# Patient Record
Sex: Male | Born: 1940
Health system: Southern US, Community
[De-identification: ages and names within clinical notes are randomized; demographics above are authoritative.]

## PROBLEM LIST (undated history)

## (undated) DIAGNOSIS — E785 Hyperlipidemia, unspecified: Secondary | ICD-10-CM

## (undated) DIAGNOSIS — K219 Gastro-esophageal reflux disease without esophagitis: Secondary | ICD-10-CM

## (undated) DIAGNOSIS — R011 Cardiac murmur, unspecified: Secondary | ICD-10-CM

## (undated) DIAGNOSIS — I10 Essential (primary) hypertension: Secondary | ICD-10-CM

## (undated) HISTORY — PX: OTHER SURGICAL HISTORY: SHX169

## (undated) HISTORY — DX: Hyperlipidemia, unspecified: E78.5

## (undated) HISTORY — DX: Cardiac murmur, unspecified: R01.1

## (undated) HISTORY — DX: Essential (primary) hypertension: I10

## (undated) HISTORY — DX: Gastro-esophageal reflux disease without esophagitis: K21.9

---
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
## 2008-02-06 ENCOUNTER — Ambulatory Visit (HOSPITAL_COMMUNITY): Admission: RE | Admit: 2008-02-06 | Discharge: 2008-02-06 | Payer: Self-pay | Admitting: Specialist

## 2009-08-09 ENCOUNTER — Ambulatory Visit: Payer: Self-pay | Admitting: Internal Medicine

## 2009-08-09 DIAGNOSIS — K219 Gastro-esophageal reflux disease without esophagitis: Secondary | ICD-10-CM

## 2009-08-09 DIAGNOSIS — R011 Cardiac murmur, unspecified: Secondary | ICD-10-CM | POA: Insufficient documentation

## 2009-08-09 DIAGNOSIS — I10 Essential (primary) hypertension: Secondary | ICD-10-CM | POA: Insufficient documentation

## 2009-08-09 DIAGNOSIS — E785 Hyperlipidemia, unspecified: Secondary | ICD-10-CM

## 2009-08-10 ENCOUNTER — Telehealth: Payer: Self-pay | Admitting: Internal Medicine

## 2009-08-11 ENCOUNTER — Telehealth: Payer: Self-pay

## 2009-08-26 ENCOUNTER — Ambulatory Visit: Payer: Self-pay | Admitting: Cardiology

## 2009-08-26 ENCOUNTER — Ambulatory Visit (HOSPITAL_COMMUNITY): Admission: RE | Admit: 2009-08-26 | Discharge: 2009-08-26 | Payer: Self-pay | Admitting: Internal Medicine

## 2009-08-26 ENCOUNTER — Ambulatory Visit: Payer: Self-pay

## 2009-08-26 ENCOUNTER — Encounter: Payer: Self-pay | Admitting: Internal Medicine

## 2010-07-16 LAB — CONVERTED CEMR LAB
ALT: 48 units/L (ref 0–53)
AST: 35 units/L (ref 0–37)
Albumin: 4.3 g/dL (ref 3.5–5.2)
Alkaline Phosphatase: 83 units/L (ref 39–117)
BUN: 19 mg/dL (ref 6–23)
Basophils Absolute: 0 10*3/uL (ref 0.0–0.1)
Basophils Relative: 0.7 % (ref 0.0–3.0)
Bilirubin, Direct: 0 mg/dL (ref 0.0–0.3)
CO2: 33 meq/L — ABNORMAL HIGH (ref 19–32)
Calcium: 9.6 mg/dL (ref 8.4–10.5)
Chloride: 107 meq/L (ref 96–112)
Cholesterol: 234 mg/dL — ABNORMAL HIGH (ref 0–200)
Creatinine, Ser: 1.5 mg/dL (ref 0.4–1.5)
Eosinophils Absolute: 0.2 10*3/uL (ref 0.0–0.7)
Eosinophils Relative: 2.6 % (ref 0.0–5.0)
GFR calc non Af Amer: 49.53 mL/min (ref >60)
Glucose, Bld: 92 mg/dL (ref 70–99)
HCT: 50.7 % (ref 39.0–52.0)
Hemoglobin: 16.4 g/dL (ref 13.0–17.0)
Lymphocytes Relative: 31.6 % (ref 12.0–46.0)
Lymphs Abs: 2.2 10*3/uL (ref 0.7–4.0)
MCHC: 32.4 g/dL (ref 30.0–36.0)
MCV: 94.5 fL (ref 78.0–100.0)
Monocytes Absolute: 0.7 10*3/uL (ref 0.1–1.0)
Monocytes Relative: 9.8 % (ref 3.0–12.0)
Neutro Abs: 4 10*3/uL (ref 1.4–7.7)
Neutrophils Relative %: 55.3 % (ref 43.0–77.0)
PSA: 0.52 ng/mL (ref 0.10–4.00)
Platelets: 218 10*3/uL (ref 150.0–400.0)
Potassium: 5.1 meq/L (ref 3.5–5.1)
RBC: 5.37 M/uL (ref 4.22–5.81)
RDW: 13.8 % (ref 11.5–14.6)
Sodium: 146 meq/L — ABNORMAL HIGH (ref 135–145)
TSH: 2.28 microintl units/mL (ref 0.35–5.50)
Total Bilirubin: 0.4 mg/dL (ref 0.3–1.2)
Total Protein: 7.7 g/dL (ref 6.0–8.3)
WBC: 7.1 10*3/uL (ref 4.5–10.5)

## 2010-07-18 NOTE — Progress Notes (Signed)
Summary: 2nd request for phone #  Phone Note Outgoing Call   Call placed by: Duard Brady LPN,  August 11, 2009 1:32 PM Call placed to: Patient Summary of Call: attempt to call again to obtain previous doctor/clinic name and ph number to be able to get records.  Initial call taken by: Duard Brady LPN,  August 11, 2009 1:33 PM

## 2010-07-18 NOTE — Assessment & Plan Note (Signed)
Summary: NEW PT/NJR   Vital Signs:  Patient profile:   70 year old male Height:      72.5 inches Weight:      252 pounds BMI:     33.83 Temp:     97.3 degrees F oral BP sitting:   180 / 100  (left arm) Cuff size:   large  Vitals Entered By: Duard Brady LPN (August 09, 2009 1:30 PM) CC: new pt - out of HTN meds - , saw his wife (patty) yesterday Is Patient Diabetic? No   CC:  new pt - out of HTN meds -  and saw his wife (patty) yesterday.  History of Present Illness: 70 year old patient who is seen today to establish with our practice.  He has a 8 year history of treated hypertension, and a two-year history of treated dyslipidemia.  He also has a history of gastroesophageal reflux disease, which has been stable.  Preventive Screening-Counseling & Management  Alcohol-Tobacco     Smoking Status: quit  Allergies (verified): No Known Drug Allergies  Past History:  Past Medical History: Hyperlipidemia Hypertension systolic murmur GERD hemorrhoids  Past Surgical History: status post outpatient elbow  surgery bilaterally Tonsillectomy 1948 no prior colonoscopy ( declines)  Family History: Reviewed history and no changes required. both parents died at age 69.  Father the complications of a" bile duct tumor" history of hypertension and diabetes mother died of acute MI at age 69 two brothers 4 sisters, unremarkable except for hypertension  Social History: Reviewed history and no changes required. Married Carpenter discontinued tobacco 3 years ago 7 children and stepchildren, 11 grandchildren, 13 great-grandchildrenSmoking Status:  quit  Review of Systems  The patient denies anorexia, fever, weight loss, weight gain, vision loss, decreased hearing, hoarseness, chest pain, syncope, dyspnea on exertion, peripheral edema, prolonged cough, headaches, hemoptysis, abdominal pain, melena, hematochezia, severe indigestion/heartburn, hematuria, incontinence, genital  sores, muscle weakness, suspicious skin lesions, transient blindness, difficulty walking, depression, unusual weight change, abnormal bleeding, enlarged lymph nodes, angioedema, breast masses, and testicular masses.    Physical Exam  General:  overweight-appearing.blood pressure is 150/100 Head:  Normocephalic and atraumatic without obvious abnormalities. No apparent alopecia or balding. Eyes:  No corneal or conjunctival inflammation noted. EOMI. Perrla. Funduscopic exam benign, without hemorrhages, exudates or papilledema. Vision grossly normal. Ears:  External ear exam shows no significant lesions or deformities.  Otoscopic examination reveals clear canals, tympanic membranes are intact bilaterally without bulging, retraction, inflammation or discharge. Hearing is grossly normal bilaterally. Nose:  External nasal examination shows no deformity or inflammation. Nasal mucosa are pink and moist without lesions or exudates. Mouth:  Oral mucosa and oropharynx without lesions or exudates.  edentulous Neck:  No deformities, masses, or tenderness noted. Chest Wall:  No deformities, masses, tenderness or gynecomastia noted. Breasts:  No masses or gynecomastia noted Lungs:  Normal respiratory effort, chest expands symmetrically. Lungs are clear to auscultation, no crackles or wheezes. Heart:  grade 2/6 systolic murmur, loudest at the primary aortic area Abdomen:  Bowel sounds positive,abdomen soft and non-tender without masses, organomegaly or hernias noted. Rectal:  external hemorrhoid(s).  still in there Genitalia:  Testes bilaterally descended without nodularity, tenderness or masses. No scrotal masses or lesions. No penis lesions or urethral discharge. Prostate:  1+ enlarged.   Msk:  No deformity or scoliosis noted of thoracic or lumbar spine.   Pulses:   posterior tibial pulses intact;  dorsalis pedis pulses faint Extremities:  No clubbing, cyanosis, edema, or deformity noted with normal  full range  of motion of all joints.   Neurologic:  No cranial nerve deficits noted. Station and gait are normal. Plantar reflexes are down-going bilaterally. DTRs are symmetrical throughout. Sensory, motor and coordinative functions appear intact. Skin:  Intact without suspicious lesions or rashes Cervical Nodes:  No lymphadenopathy noted Axillary Nodes:  No palpable lymphadenopathy Inguinal Nodes:  No significant adenopathy Psych:  Cognition and judgment appear intact. Alert and cooperative with normal attention span and concentration. No apparent delusions, illusions, hallucinations   Impression & Recommendations:  Problem # 1:  GERD (ICD-530.81)  Orders: Venipuncture (16109) TLB-BMP (Basic Metabolic Panel-BMET) (80048-METABOL) TLB-CBC Platelet - w/Differential (85025-CBCD) TLB-Hepatic/Liver Function Pnl (80076-HEPATIC) TLB-TSH (Thyroid Stimulating Hormone) (84443-TSH) TLB-Cholesterol, Total (82465-CHO) TLB-PSA (Prostate Specific Antigen) (84153-PSA)  Problem # 2:  HYPERTENSION (ICD-401.9)  His updated medication list for this problem includes:    Amlodipine Besylate 5 Mg Tabs (Amlodipine besylate) ..... Qd    Ramipril 10 Mg Caps (Ramipril) ..... Qd  Orders: Venipuncture (60454) TLB-BMP (Basic Metabolic Panel-BMET) (80048-METABOL) TLB-CBC Platelet - w/Differential (85025-CBCD) TLB-Hepatic/Liver Function Pnl (80076-HEPATIC) TLB-TSH (Thyroid Stimulating Hormone) (84443-TSH) TLB-Cholesterol, Total (82465-CHO) TLB-PSA (Prostate Specific Antigen) (84153-PSA)  Problem # 3:  HYPERTENSION, MILD (ICD-401.1)  His updated medication list for this problem includes:    Amlodipine Besylate 5 Mg Tabs (Amlodipine besylate) ..... Qd    Ramipril 10 Mg Caps (Ramipril) ..... Qd  Orders: EKG w/ Interpretation (93000)  Complete Medication List: 1)  Simvastatin 20 Mg Tabs (Simvastatin) .... Qd 2)  Amlodipine Besylate 5 Mg Tabs (Amlodipine besylate) .... Qd 3)  Ramipril 10 Mg Caps (Ramipril) ....  Qd  Other Orders: Echo Referral (Echo)  Patient Instructions: 1)  Limit your Sodium (Salt). 2)  It is important that you exercise regularly at least 20 minutes 5 times a week. If you develop chest pain, have severe difficulty breathing, or feel very tired , stop exercising immediately and seek medical attention. 3)  You need to lose weight. Consider a lower calorie diet and regular exercise.  4)  Check your Blood Pressure regularly. If it is above: 150/90  you should make an appointment. Prescriptions: RAMIPRIL 10 MG CAPS (RAMIPRIL) qd  #90 x 6   Entered and Authorized by:   Gordy Savers  MD   Signed by:   Gordy Savers  MD on 08/09/2009   Method used:   Print then Give to Patient   RxID:   [id number] AMLODIPINE BESYLATE 5 MG TABS (AMLODIPINE BESYLATE) qd  #90 x 6   Entered and Authorized by:   Gordy Savers  MD   Signed by:   Gordy Savers  MD on 08/09/2009   Method used:   Print then Give to Patient   RxID:   [id number] SIMVASTATIN 20 MG TABS (SIMVASTATIN) qd  #90 x 6   Entered and Authorized by:   Gordy Savers  MD   Signed by:   Gordy Savers  MD on 08/09/2009   Method used:   Print then Give to Patient   RxID:   [id number]

## 2010-07-18 NOTE — Progress Notes (Signed)
Summary: need dr. name and number  Phone Note Outgoing Call   Call placed by: Duard Brady LPN,  August 10, 2009 11:56 AM Call placed to: Patient Summary of Call: called hm# - ans mach - LMTCB with previous doctor/clinic name and phone  # so we can send release of information for records. KIK Initial call taken by: Duard Brady LPN,  August 10, 2009 11:57 AM

## 2010-09-11 ENCOUNTER — Encounter: Payer: Self-pay | Admitting: Internal Medicine

## 2010-09-12 ENCOUNTER — Encounter: Payer: Self-pay | Admitting: Internal Medicine

## 2010-09-12 ENCOUNTER — Ambulatory Visit (INDEPENDENT_AMBULATORY_CARE_PROVIDER_SITE_OTHER): Payer: Medicare PPO | Admitting: Internal Medicine

## 2010-09-12 DIAGNOSIS — M25569 Pain in unspecified knee: Secondary | ICD-10-CM

## 2010-09-12 DIAGNOSIS — I10 Essential (primary) hypertension: Secondary | ICD-10-CM

## 2010-09-12 DIAGNOSIS — M25561 Pain in right knee: Secondary | ICD-10-CM

## 2010-09-12 NOTE — Progress Notes (Signed)
  Subjective:    Patient ID: Willie Smith, male    DOB: 1940/09/10, 70 y.o.   MRN: [id number]  HPI   70 year old patient who has a history of hypertension. He is seen in this office very infrequently. He is retired and spends much time on the road and in [place]. He does state that he has been compliant with his medication blood pressure on arrival today 160/100. He also has a history of dyslipidemia and there has been no recent laboratory testing done   He presents today with a chief complaint of right knee pain this has been present for 2 weeks and began while he was fishing in [place]. He has about a 4 year history of mild left knee pain at his right knee has never been problematic. His weight is 258   Review of Systems  Constitutional: Negative for fever, chills, appetite change and fatigue.  HENT: Negative for hearing loss, ear pain, congestion, sore throat, trouble swallowing, neck stiffness, dental problem, voice change and tinnitus.   Eyes: Negative for pain, discharge and visual disturbance.  Respiratory: Negative for cough, chest tightness, wheezing and stridor.   Cardiovascular: Negative for chest pain, palpitations and leg swelling.  Gastrointestinal: Negative for nausea, vomiting, abdominal pain, diarrhea, constipation, blood in stool and abdominal distention.  Genitourinary: Negative for urgency, hematuria, flank pain, discharge, difficulty urinating and genital sores.  Musculoskeletal: Positive for joint swelling and gait problem. Negative for myalgias, back pain and arthralgias.  Skin: Negative for rash.  Neurological: Negative for dizziness, syncope, speech difficulty, weakness, numbness and headaches.  Hematological: Negative for adenopathy. Does not bruise/bleed easily.  Psychiatric/Behavioral: Negative for behavioral problems and dysphoric mood. The patient is not nervous/anxious.        Objective:   Physical Exam  Constitutional: He appears  well-developed. No distress.        Blood pressure 160/100  Musculoskeletal:        A mild effusion was noted of the right knee          Assessment & Plan:   right knee pain. Probable meniscal tear. He has been quite symptomatic for 2 weeks we'll set up for orthopedic referral  Hypertension. Poor control. He has recently drove back from [place]. Home blood pressure monitor and encouraged as well as a two-week followup  Dyslipidemia. Needs followup lab  Health maintenance. Needs complete physical

## 2010-09-12 NOTE — Patient Instructions (Signed)
Orthopedic followup as scheduled Limit your sodium (Salt) intake  Please check your blood pressure on a regular basis.  If it is consistently greater than 150/90, please make an office appointment.  Return in two weeks for follow-up

## 2010-09-26 ENCOUNTER — Ambulatory Visit (INDEPENDENT_AMBULATORY_CARE_PROVIDER_SITE_OTHER): Payer: Medicare PPO | Admitting: Internal Medicine

## 2010-09-26 ENCOUNTER — Encounter: Payer: Self-pay | Admitting: Internal Medicine

## 2010-09-26 VITALS — BP 160/90 | Temp 97.5°F | Wt 255.0 lb

## 2010-09-26 DIAGNOSIS — I10 Essential (primary) hypertension: Secondary | ICD-10-CM

## 2010-09-26 MED ORDER — AMLODIPINE BESYLATE 5 MG PO TABS: 10.0000 mg | ORAL_TABLET | Freq: Every day | ORAL | Status: DC

## 2010-09-26 MED ORDER — SIMVASTATIN 20 MG PO TABS: 20.0000 mg | ORAL_TABLET | Freq: Every day | ORAL | Status: DC

## 2010-09-26 MED ORDER — RAMIPRIL 10 MG PO CAPS: 10.0000 mg | ORAL_CAPSULE | Freq: Every day | ORAL | Status: DC

## 2010-09-26 NOTE — Progress Notes (Signed)
  Subjective:    Patient ID: Willie Smith, male    DOB: [birth year], 70 y.o.   MRN: [id number]  HPI  70 year old patient who is seen today for followup of his hypertension. He has been monitoring home blood pressure readings were consistently high readings. He feels well except for some persistent right knee pain. He does have a orthopedic appointment scheduled.    Review of Systems  Musculoskeletal: Positive for joint swelling and gait problem.       Objective:   Physical Exam  Constitutional: He appears well-developed and well-nourished. No distress.       Blood pressure 160/90 both arms          Assessment & Plan:  Hypertension. Suboptimal control. We'll increase his amlodipine to 10 mg daily. If blood pressures are consistently less than 140/90 after 2 weeks we'll add hydrochlorothiazide 25 mg daily he return in 6 weeks for followup

## 2010-09-26 NOTE — Patient Instructions (Signed)
Please check your blood pressure on a regular basis.  If it is consistently greater than 150/90, please make an office appointment.  If  blood pressure reading he is consistently greater than 140/90 start hydrochlorothiazide 25 mg daily    It is important that you exercise regularly, at least 20 minutes 3 to 4 times per week.  If you develop chest pain or shortness of breath seek  medical attention.  Limit your sodium (Salt) intake

## 2010-09-27 ENCOUNTER — Other Ambulatory Visit: Payer: Self-pay | Admitting: Internal Medicine

## 2010-09-27 MED ORDER — AMLODIPINE BESYLATE 5 MG PO TABS: 10.0000 mg | ORAL_TABLET | Freq: Every day | ORAL | Status: DC

## 2011-01-18 ENCOUNTER — Encounter: Payer: Self-pay | Admitting: Internal Medicine

## 2011-01-18 ENCOUNTER — Ambulatory Visit (INDEPENDENT_AMBULATORY_CARE_PROVIDER_SITE_OTHER): Payer: Medicare PPO | Admitting: Internal Medicine

## 2011-01-18 VITALS — BP 170/90 | Temp 98.0°F | Wt 256.0 lb

## 2011-01-18 DIAGNOSIS — I10 Essential (primary) hypertension: Secondary | ICD-10-CM

## 2011-01-18 MED ORDER — HYDROCHLOROTHIAZIDE 25 MG PO TABS: 25.0000 mg | ORAL_TABLET | Freq: Every day | ORAL | Status: DC

## 2011-01-18 MED ORDER — AMLODIPINE BESYLATE 10 MG PO TABS: 10.0000 mg | ORAL_TABLET | Freq: Every day | ORAL | Status: DC

## 2011-01-18 NOTE — Progress Notes (Signed)
  Subjective:    Patient ID: Willie Smith, male    DOB: May 28, 1941, 70 y.o.   MRN: [id number]  HPI  70 year old patient who is seen today for followup of his hypertension. He is doing quite well;  he has had mild pedal edema since increasing his amlodipine to 10 mg daily no chest pain or shortness of breath.    Review of Systems  Constitutional: Negative for fever, chills, appetite change and fatigue.  HENT: Negative for hearing loss, ear pain, congestion, sore throat, trouble swallowing, neck stiffness, dental problem, voice change and tinnitus.   Eyes: Negative for pain, discharge and visual disturbance.  Respiratory: Negative for cough, chest tightness, wheezing and stridor.   Cardiovascular: Negative for chest pain, palpitations and leg swelling.  Gastrointestinal: Negative for nausea, vomiting, abdominal pain, diarrhea, constipation, blood in stool and abdominal distention.  Genitourinary: Negative for urgency, hematuria, flank pain, discharge, difficulty urinating and genital sores.  Musculoskeletal: Negative for myalgias, back pain, joint swelling, arthralgias and gait problem.  Skin: Negative for rash.  Neurological: Negative for dizziness, syncope, speech difficulty, weakness, numbness and headaches.  Hematological: Negative for adenopathy. Does not bruise/bleed easily.  Psychiatric/Behavioral: Negative for behavioral problems and dysphoric mood. The patient is not nervous/anxious.        Objective:   Physical Exam  Constitutional: He is oriented to person, place, and time. He appears well-developed.       Overweight. Weight 256 Blood pressure 150/80  HENT:  Head: Normocephalic.  Right Ear: External ear normal.  Left Ear: External ear normal.  Eyes: Conjunctivae and EOM are normal.  Neck: Normal range of motion.  Cardiovascular: Normal rate and normal heart sounds.   Pulmonary/Chest: Breath sounds normal.  Abdominal: Bowel sounds are normal.  Genitourinary:       Right  greater than left lower extremity edema  Musculoskeletal: Normal range of motion. He exhibits edema. He exhibits no tenderness.  Neurological: He is alert and oriented to person, place, and time.  Psychiatric: He has a normal mood and affect. His behavior is normal.          Assessment & Plan:   Hypertension suboptimal control Pedal edema  Restricted salt  diet weight loss exercise all encouraged We'll place on hydrochlorothiazide 25 mg daily

## 2011-01-18 NOTE — Patient Instructions (Signed)
Limit your sodium (Salt) intake    It is important that you exercise regularly, at least 20 minutes 3 to 4 times per week.  If you develop chest pain or shortness of breath seek  medical attention.  You need to lose weight.  Consider a lower calorie diet and regular exercise.  Return in 3 months for follow-up  

## 2011-10-31 ENCOUNTER — Other Ambulatory Visit: Payer: Self-pay | Admitting: Internal Medicine

## 2012-02-05 ENCOUNTER — Encounter: Payer: Self-pay | Admitting: Internal Medicine

## 2012-02-05 ENCOUNTER — Ambulatory Visit (INDEPENDENT_AMBULATORY_CARE_PROVIDER_SITE_OTHER): Payer: Medicare PPO | Admitting: Internal Medicine

## 2012-02-05 VITALS — BP 132/80 | Temp 98.1°F | Wt 249.0 lb

## 2012-02-05 DIAGNOSIS — E785 Hyperlipidemia, unspecified: Secondary | ICD-10-CM

## 2012-02-05 DIAGNOSIS — I10 Essential (primary) hypertension: Secondary | ICD-10-CM

## 2012-02-05 MED ORDER — RAMIPRIL 10 MG PO CAPS: 10.0000 mg | ORAL_CAPSULE | Freq: Every day | ORAL | Status: DC

## 2012-02-05 MED ORDER — AMLODIPINE BESYLATE 10 MG PO TABS: 10.0000 mg | ORAL_TABLET | Freq: Every day | ORAL | Status: DC

## 2012-02-05 MED ORDER — HYDROCHLOROTHIAZIDE 25 MG PO TABS: 25.0000 mg | ORAL_TABLET | Freq: Every day | ORAL | Status: DC

## 2012-02-05 MED ORDER — SIMVASTATIN 20 MG PO TABS: 20.0000 mg | ORAL_TABLET | Freq: Every day | ORAL | Status: DC

## 2012-02-05 NOTE — Patient Instructions (Signed)
Limit your sodium (Salt) intake  Please check your blood pressure on a regular basis.  If it is consistently greater than 150/90, please make an office appointment.  You need to lose weight.  Consider a lower calorie diet and regular exercise.    It is important that you exercise regularly, at least 20 minutes 3 to 4 times per week.  If you develop chest pain or shortness of breath seek  medical attention.  Return in 6 months for follow-up  

## 2012-02-05 NOTE — Progress Notes (Signed)
Subjective:    Patient ID: Willie Smith, male    DOB: [birth year], 71 y.o.   MRN: [id number]  HPI  71 year old patient who is seen today for followup. He has treated hypertension and dyslipidemia. He is done remarkably well. Since his last visit here he has had bilateral knee arthroscopic surgery and doing quite well. He is discontinued the use of sweet tea and there has been some modest weight loss. He feels quite well.  Past Medical History  Diagnosis Date  . Hyperlipidemia   . Hypertension   . Systolic murmur   . GERD (gastroesophageal reflux disease)   . Hemorrhoids     History   Social History  . Marital Status: Single    Spouse Name: N/A    Number of Children: N/A  . Years of Education: N/A   Occupational History  . Not on file.   Social History Main Topics  . Smoking status: Former Smoker    Quit date: 06/18/2005  . Smokeless tobacco: Never Used  . Alcohol Use: Yes     occasionally  . Drug Use: No  . Sexually Active: Not on file   Other Topics Concern  . Not on file   Social History Narrative  . No narrative on file    Past Surgical History  Procedure Date  . Surgery elbow bilaterally   . Tonsillectomy 1948  . Colonsocopy     declines    Family History  Problem Relation Age of Onset  . Heart attack Mother   . Hypertension Father   . Diabetes Father   . Hypertension Brother   . Hypertension Brother   . Hypertension Sister   . Hypertension Sister   . Hypertension Sister     No Known Allergies  Current Outpatient Prescriptions on File Prior to Visit  Medication Sig Dispense Refill  . ramipril (ALTACE) 10 MG capsule TAKE ONE CAPSULE BY MOUTH EVERY DAY  90 capsule  0  . simvastatin (ZOCOR) 20 MG tablet TAKE ONE TABLET BY MOUTH EVERY DAY AT BEDTIME  90 tablet  0  . DISCONTD: amLODipine (NORVASC) 10 MG tablet Take 1 tablet (10 mg total) by mouth daily.  90 tablet  6  . DISCONTD: hydrochlorothiazide 25 MG tablet Take 1 tablet (25 mg total) by mouth  daily.  90 tablet  6    BP 132/80  Temp 98.1 F (36.7 C) (Oral)  Wt 249 lb (112.946 kg)      Review of Systems  Constitutional: Negative for fever, chills, appetite change and fatigue.  HENT: Negative for hearing loss, ear pain, congestion, sore throat, trouble swallowing, neck stiffness, dental problem, voice change and tinnitus.   Eyes: Negative for pain, discharge and visual disturbance.  Respiratory: Negative for cough, chest tightness, wheezing and stridor.   Cardiovascular: Negative for chest pain, palpitations and leg swelling.  Gastrointestinal: Negative for nausea, vomiting, abdominal pain, diarrhea, constipation, blood in stool and abdominal distention.  Genitourinary: Negative for urgency, hematuria, flank pain, discharge, difficulty urinating and genital sores.  Musculoskeletal: Negative for myalgias, back pain, joint swelling, arthralgias and gait problem.  Skin: Negative for rash.  Neurological: Negative for dizziness, syncope, speech difficulty, weakness, numbness and headaches.  Hematological: Negative for adenopathy. Does not bruise/bleed easily.  Psychiatric/Behavioral: Negative for behavioral problems and dysphoric mood. The patient is not nervous/anxious.        Objective:   Physical Exam  Constitutional: He is oriented to person, place, and time. He appears well-developed.  HENT:  Head: Normocephalic.  Right Ear: External ear normal.  Left Ear: External ear normal.  Eyes: Conjunctivae and EOM are normal.  Neck: Normal range of motion.  Cardiovascular: Normal rate and normal heart sounds.   Pulmonary/Chest: Breath sounds normal.  Abdominal: Bowel sounds are normal.  Musculoskeletal: Normal range of motion. He exhibits no edema and no tenderness.  Neurological: He is alert and oriented to person, place, and time.  Psychiatric: He has a normal mood and affect. His behavior is normal.          Assessment & Plan:   Hypertension. We'll continue  present regimen medicines refilled Dyslipidemia stable. Set up for a complete physical. Check a lipid profile at that time.

## 2013-01-14 ENCOUNTER — Ambulatory Visit (INDEPENDENT_AMBULATORY_CARE_PROVIDER_SITE_OTHER): Payer: Medicare PPO | Admitting: Internal Medicine

## 2013-01-14 ENCOUNTER — Encounter: Payer: Self-pay | Admitting: Internal Medicine

## 2013-01-14 VITALS — BP 160/90 | HR 68 | Temp 97.8°F | Resp 20 | Wt 257.0 lb

## 2013-01-14 DIAGNOSIS — E785 Hyperlipidemia, unspecified: Secondary | ICD-10-CM

## 2013-01-14 DIAGNOSIS — Z23 Encounter for immunization: Secondary | ICD-10-CM

## 2013-01-14 DIAGNOSIS — I10 Essential (primary) hypertension: Secondary | ICD-10-CM

## 2013-01-14 DIAGNOSIS — K219 Gastro-esophageal reflux disease without esophagitis: Secondary | ICD-10-CM

## 2013-01-14 DIAGNOSIS — R011 Cardiac murmur, unspecified: Secondary | ICD-10-CM

## 2013-01-14 LAB — COMPREHENSIVE METABOLIC PANEL
AST: 24 U/L (ref 0–37)
BUN: 17 mg/dL (ref 6–23)
Calcium: 9.4 mg/dL (ref 8.4–10.5)
Chloride: 104 mEq/L (ref 96–112)
Creatinine, Ser: 1.4 mg/dL (ref 0.4–1.5)
GFR: 53.98 mL/min — ABNORMAL LOW (ref >60.00)

## 2013-01-14 LAB — CBC WITH DIFFERENTIAL/PLATELET
Basophils Relative: 0.6 % (ref 0.0–3.0)
Eosinophils Relative: 2.8 % (ref 0.0–5.0)
Hemoglobin: 15.6 g/dL (ref 13.0–17.0)
Lymphocytes Relative: 26.2 % (ref 12.0–46.0)
MCHC: 33.1 g/dL (ref 30.0–36.0)
Monocytes Relative: 9.9 % (ref 3.0–12.0)
Neutro Abs: 4.9 10*3/uL (ref 1.4–7.7)
RBC: 5.17 Mil/uL (ref 4.22–5.81)

## 2013-01-14 LAB — LIPID PANEL
Cholesterol: 149 mg/dL (ref 0–200)
LDL Cholesterol: 89 mg/dL (ref 0–99)
Triglycerides: 148 mg/dL (ref 0.0–149.0)

## 2013-01-14 MED ORDER — AMLODIPINE BESYLATE 10 MG PO TABS: 10.0000 mg | ORAL_TABLET | Freq: Every day | ORAL | Status: DC

## 2013-01-14 MED ORDER — SIMVASTATIN 20 MG PO TABS: 20.0000 mg | ORAL_TABLET | Freq: Every day | ORAL | Status: DC

## 2013-01-14 MED ORDER — HYDROCHLOROTHIAZIDE 25 MG PO TABS: 25.0000 mg | ORAL_TABLET | Freq: Every day | ORAL | Status: DC

## 2013-01-14 MED ORDER — RAMIPRIL 10 MG PO CAPS: 10.0000 mg | ORAL_CAPSULE | Freq: Every day | ORAL | Status: DC

## 2013-01-14 NOTE — Patient Instructions (Signed)
Limit your sodium (Salt) intake  You need to lose weight.  Consider a lower calorie diet and regular exercise.    It is important that you exercise regularly, at least 20 minutes 3 to 4 times per week.  If you develop chest pain or shortness of breath seek  medical attention.  Please check your blood pressure on a regular basis.  If it is consistently greater than 150/90, please make an office appointment.  Return in one year for follow-up

## 2013-01-14 NOTE — Progress Notes (Signed)
Subjective:    Patient ID: Willie Smith, male    DOB: Feb 18, 1941, 72 y.o.   MRN: [id number]  HPI  72 year old patient who is seen today for followup. He has not been seen in about one year. Stable medical problems include hypertension dyslipidemia gastroesophageal reflux disease. He is in the lab for approximately 3 years. He is doing quite well today. There is been some modest weight gain. He also has a history of systolic murmur that was evaluated by a 2-D echocardiogram. This revealed aortic sclerosis only.   Past Medical History  Diagnosis Date  . Hyperlipidemia   . Hypertension   . Systolic murmur   . GERD (gastroesophageal reflux disease)   . Hemorrhoids     History   Social History  . Marital Status: Single    Spouse Name: N/A    Number of Children: N/A  . Years of Education: N/A   Occupational History  . Not on file.   Social History Main Topics  . Smoking status: Former Smoker    Quit date: 06/18/2005  . Smokeless tobacco: Never Used  . Alcohol Use: Yes     Comment: occasionally  . Drug Use: No  . Sexually Active: Not on file   Other Topics Concern  . Not on file   Social History Narrative  . No narrative on file    Past Surgical History  Procedure Laterality Date  . Surgery elbow bilaterally    . Tonsillectomy  1948  . Colonsocopy      declines    Family History  Problem Relation Age of Onset  . Heart attack Mother   . Hypertension Father   . Diabetes Father   . Hypertension Brother   . Hypertension Brother   . Hypertension Sister   . Hypertension Sister   . Hypertension Sister     No Known Allergies  Current Outpatient Prescriptions on File Prior to Visit  Medication Sig Dispense Refill  . amLODipine (NORVASC) 10 MG tablet Take 1 tablet (10 mg total) by mouth daily.  90 tablet  3  . hydrochlorothiazide (HYDRODIURIL) 25 MG tablet Take 1 tablet (25 mg total) by mouth daily.  90 tablet  3  . ramipril (ALTACE) 10 MG capsule Take 1 capsule  (10 mg total) by mouth daily.  90 capsule  3  . simvastatin (ZOCOR) 20 MG tablet Take 1 tablet (20 mg total) by mouth at bedtime.  90 tablet  3   No current facility-administered medications on file prior to visit.    BP 160/90  Pulse 68  Temp(Src) 97.8 F (36.6 C) (Oral)  Resp 20  Wt 257 lb (116.574 kg)  BMI 34.36 kg/m2  SpO2 98%      Review of Systems  Constitutional: Negative for fever, chills, appetite change and fatigue.  HENT: Negative for hearing loss, ear pain, congestion, sore throat, trouble swallowing, neck stiffness, dental problem, voice change and tinnitus.   Eyes: Negative for pain, discharge and visual disturbance.  Respiratory: Negative for cough, chest tightness, wheezing and stridor.   Cardiovascular: Negative for chest pain, palpitations and leg swelling.  Gastrointestinal: Negative for nausea, vomiting, abdominal pain, diarrhea, constipation, blood in stool and abdominal distention.  Genitourinary: Negative for urgency, hematuria, flank pain, discharge, difficulty urinating and genital sores.  Musculoskeletal: Negative for myalgias, back pain, joint swelling, arthralgias and gait problem.  Skin: Negative for rash.  Neurological: Negative for dizziness, syncope, speech difficulty, weakness, numbness and headaches.  Hematological: Negative for adenopathy. Does  not bruise/bleed easily.  Psychiatric/Behavioral: Negative for behavioral problems and dysphoric mood. The patient is not nervous/anxious.        Objective:   Physical Exam  Constitutional: He is oriented to person, place, and time. He appears well-developed.  Repeat blood pressure as low as 140/84  HENT:  Head: Normocephalic.  Right Ear: External ear normal.  Left Ear: External ear normal.  Eyes: Conjunctivae and EOM are normal.  Neck: Normal range of motion.  Cardiovascular: Normal rate and normal heart sounds.   Pulmonary/Chest: Breath sounds normal.  Abdominal: Bowel sounds are normal.   Musculoskeletal: Normal range of motion. He exhibits no edema and no tenderness.  Neurological: He is alert and oriented to person, place, and time.  Psychiatric: He has a normal mood and affect. His behavior is normal.          Assessment & Plan:   Hypertension Dyslipidemia Obesity  Weight loss encouraged More careful home blood pressure monitoring recommended Increased activity Lab update CPX one year

## 2013-01-14 NOTE — Progress Notes (Signed)
  Subjective:    Patient ID: Willie Smith, male    DOB: Jun 20, 1940, 72 y.o.   MRN: [id number]  HPI  Wt Readings from Last 3 Encounters:  01/14/13 257 lb (116.574 kg)  02/05/12 249 lb (112.946 kg)  01/18/11 256 lb (116.121 kg)    Review of Systems     Objective:   Physical Exam        Assessment & Plan:

## 2013-04-21 ENCOUNTER — Ambulatory Visit (INDEPENDENT_AMBULATORY_CARE_PROVIDER_SITE_OTHER): Payer: Medicare PPO | Admitting: Internal Medicine

## 2013-04-21 ENCOUNTER — Encounter: Payer: Self-pay | Admitting: Internal Medicine

## 2013-04-21 VITALS — BP 160/80 | HR 76 | Temp 97.9°F | Resp 20 | Wt 257.0 lb

## 2013-04-21 DIAGNOSIS — E785 Hyperlipidemia, unspecified: Secondary | ICD-10-CM

## 2013-04-21 DIAGNOSIS — H6120 Impacted cerumen, unspecified ear: Secondary | ICD-10-CM

## 2013-04-21 DIAGNOSIS — H6123 Impacted cerumen, bilateral: Secondary | ICD-10-CM

## 2013-04-21 DIAGNOSIS — I10 Essential (primary) hypertension: Secondary | ICD-10-CM

## 2013-04-21 NOTE — Progress Notes (Signed)
Subjective:    Patient ID: Willie Smith, male    DOB: November 25, 1940, 72 y.o.   MRN: [id number] Pre-visit discussion using our clinic review tool. No additional management support is needed unless otherwise documented below in the visit note.  HPI  72 year old patient who has a history of treated hypertension and dyslipidemia. He states that he was evaluated at the [place] approximately one year ago for mild hearing loss. He states that he does have a partial disability due to diminished auditory acuity. For the past 6 months he has had intermittent mild vertigo. This has worsened over the past week and a half. He describes a mild sense of unsteadiness and dizziness with a change in posture. No tinnitus or change in his hearing.    Past Medical History  Diagnosis Date  . Hyperlipidemia   . Hypertension   . Systolic murmur   . GERD (gastroesophageal reflux disease)   . Hemorrhoids     History   Social History  . Marital Status: Single    Spouse Name: N/A    Number of Children: N/A  . Years of Education: N/A   Occupational History  . Not on file.   Social History Main Topics  . Smoking status: Former Smoker    Quit date: 06/18/2005  . Smokeless tobacco: Never Used  . Alcohol Use: Yes     Comment: occasionally  . Drug Use: No  . Sexual Activity: Not on file   Other Topics Concern  . Not on file   Social History Narrative  . No narrative on file    Past Surgical History  Procedure Laterality Date  . Surgery elbow bilaterally    . Tonsillectomy  1948  . Colonsocopy      declines    Family History  Problem Relation Age of Onset  . Heart attack Mother   . Hypertension Father   . Diabetes Father   . Hypertension Brother   . Hypertension Brother   . Hypertension Sister   . Hypertension Sister   . Hypertension Sister     No Known Allergies  Current Outpatient Prescriptions on File Prior to Visit  Medication Sig Dispense Refill  . amLODipine (NORVASC) 10 MG tablet Take  1 tablet (10 mg total) by mouth daily.  90 tablet  3  . aspirin 81 MG tablet Take 81 mg by mouth daily.      . hydrochlorothiazide (HYDRODIURIL) 25 MG tablet Take 1 tablet (25 mg total) by mouth daily.  90 tablet  3  . ramipril (ALTACE) 10 MG capsule Take 1 capsule (10 mg total) by mouth daily.  90 capsule  3  . simvastatin (ZOCOR) 20 MG tablet Take 1 tablet (20 mg total) by mouth at bedtime.  90 tablet  3   No current facility-administered medications on file prior to visit.    BP 160/80  Pulse 76  Temp(Src) 97.9 F (36.6 C) (Oral)  Resp 20  Wt 257 lb (116.574 kg)  SpO2 96%       Review of Systems  Constitutional: Negative for fever, chills, appetite change and fatigue.  HENT: Positive for hearing loss. Negative for congestion, dental problem, ear pain, sore throat, tinnitus, trouble swallowing and voice change.   Eyes: Negative for pain, discharge and visual disturbance.  Respiratory: Negative for cough, chest tightness, wheezing and stridor.   Cardiovascular: Negative for chest pain, palpitations and leg swelling.  Gastrointestinal: Negative for nausea, vomiting, abdominal pain, diarrhea, constipation, blood in stool and abdominal  distention.  Genitourinary: Negative for urgency, hematuria, flank pain, discharge, difficulty urinating and genital sores.  Musculoskeletal: Negative for arthralgias, back pain, gait problem, joint swelling, myalgias and neck stiffness.  Skin: Negative for rash.  Neurological: Positive for light-headedness. Negative for dizziness, syncope, speech difficulty, weakness, numbness and headaches.  Hematological: Negative for adenopathy. Does not bruise/bleed easily.  Psychiatric/Behavioral: Negative for behavioral problems and dysphoric mood. The patient is not nervous/anxious.        Objective:   Physical Exam  Constitutional: He is oriented to person, place, and time. He appears well-developed.  HENT:  Head: Normocephalic.  Right Ear: External  ear normal.  Left Ear: External ear normal.  Bilateral cerumen impactions  Eyes: Conjunctivae and EOM are normal.  Neck: Normal range of motion.  Cardiovascular: Normal rate and normal heart sounds.   Pulmonary/Chest: Breath sounds normal.  Abdominal: Bowel sounds are normal.  Musculoskeletal: Normal range of motion. He exhibits no edema and no tenderness.  Neurological: He is alert and oriented to person, place, and time. He has normal reflexes. No cranial nerve deficit. Coordination normal.  Psychiatric: He has a normal mood and affect. His behavior is normal.          Assessment & Plan:  Hypertension. Slightly elevated systolic reading today Dyslipidemia. Continue simvastatin Bilateral cerumen impactions. Both canals irrigated until clear Mild positional vertigo.  Repositioning exercises given to the patient. Both canals were irrigated until clear Partial deafness.  Improved with removal of cerumen impactions  Recheck 4 weeks. We'll reevaluate at that time including blood pressure. Home blood pressure monitor and encouraged

## 2013-04-21 NOTE — Patient Instructions (Signed)
Limit your sodium (Salt) intake  Please check your blood pressure on a regular basis.  If it is consistently greater than 150/90, please make an office appointment.  Return in one month for follow-up Benign Positional Vertigo Vertigo means you feel like you or your surroundings are moving when they are not. Benign positional vertigo is the most common form of vertigo. Benign means that the cause of your condition is not serious. Benign positional vertigo is more common in older adults. CAUSES  Benign positional vertigo is the result of an upset in the labyrinth system. This is an area in the middle ear that helps control your balance. This may be caused by a viral infection, head injury, or repetitive motion. However, often no specific cause is found. SYMPTOMS  Symptoms of benign positional vertigo occur when you move your head or eyes in different directions. Some of the symptoms may include:  Loss of balance and falls.  Vomiting.  Blurred vision.  Dizziness.  Nausea.  Involuntary eye movements (nystagmus). DIAGNOSIS  Benign positional vertigo is usually diagnosed by physical exam. If the specific cause of your benign positional vertigo is unknown, your caregiver may perform imaging tests, such as magnetic resonance imaging (MRI) or computed tomography (CT). TREATMENT  Your caregiver may recommend movements or procedures to correct the benign positional vertigo. Medicines such as meclizine, benzodiazepines, and medicines for nausea may be used to treat your symptoms. In rare cases, if your symptoms are caused by certain conditions that affect the inner ear, you may need surgery. HOME CARE INSTRUCTIONS   Follow your caregiver's instructions.  Move slowly. Do not make sudden body or head movements.  Avoid driving.  Avoid operating heavy machinery.  Avoid performing any tasks that would be dangerous to you or others during a vertigo episode.  Drink enough fluids to keep your  urine clear or pale yellow. SEEK IMMEDIATE MEDICAL CARE IF:   You develop problems with walking, weakness, numbness, or using your arms, hands, or legs.  You have difficulty speaking.  You develop severe headaches.  Your nausea or vomiting continues or gets worse.  You develop visual changes.  Your family or friends notice any behavioral changes.  Your condition gets worse.  You have a fever.  You develop a stiff neck or sensitivity to light. MAKE SURE YOU:   Understand these instructions.  Will watch your condition.  Will get help right away if you are not doing well or get worse. Document Released: 03/12/2006 Document Revised: 08/27/2011 Document Reviewed: 02/22/2011 [facility] Patient Information 2014 [place], [place].

## 2014-02-10 ENCOUNTER — Encounter: Payer: Self-pay | Admitting: Internal Medicine

## 2014-02-10 ENCOUNTER — Ambulatory Visit (INDEPENDENT_AMBULATORY_CARE_PROVIDER_SITE_OTHER): Payer: Commercial Managed Care - HMO | Admitting: Internal Medicine

## 2014-02-10 VITALS — BP 148/88 | HR 90 | Temp 97.5°F | Resp 20 | Ht 72.5 in | Wt 250.0 lb

## 2014-02-10 DIAGNOSIS — I10 Essential (primary) hypertension: Secondary | ICD-10-CM

## 2014-02-10 DIAGNOSIS — E785 Hyperlipidemia, unspecified: Secondary | ICD-10-CM

## 2014-02-10 MED ORDER — AMLODIPINE BESYLATE 10 MG PO TABS: 10.0000 mg | ORAL_TABLET | Freq: Every day | ORAL | Status: DC

## 2014-02-10 MED ORDER — SIMVASTATIN 20 MG PO TABS: 20.0000 mg | ORAL_TABLET | Freq: Every day | ORAL | Status: DC

## 2014-02-10 MED ORDER — RAMIPRIL 10 MG PO CAPS: 10.0000 mg | ORAL_CAPSULE | Freq: Every day | ORAL | Status: DC

## 2014-02-10 MED ORDER — HYDROCHLOROTHIAZIDE 25 MG PO TABS: 25.0000 mg | ORAL_TABLET | Freq: Every day | ORAL | Status: DC

## 2014-02-10 NOTE — Progress Notes (Signed)
Pre visit review using our clinic review tool, if applicable. No additional management support is needed unless otherwise documented below in the visit note. 

## 2014-02-10 NOTE — Patient Instructions (Signed)
Limit your sodium (Salt) intake    It is important that you exercise regularly, at least 20 minutes 3 to 4 times per week.  If you develop chest pain or shortness of breath seek  medical attention.  You need to lose weight.  Consider a lower calorie diet and regular exercise. 

## 2014-02-10 NOTE — Progress Notes (Signed)
Subjective:    Patient ID: Willie Smith, male    DOB: 1941-04-26, 73 y.o.   MRN: [id number]  HPI  73 year old patient who is seen today for general followup.  Medical problems include hypertension and dyslipidemia.  He is doing quite well.  Denies any active cardiopulmonary complaints.  Past Medical History  Diagnosis Date  . Hyperlipidemia   . Hypertension   . Systolic murmur   . GERD (gastroesophageal reflux disease)   . Hemorrhoids     History   Social History  . Marital Status: Single    Spouse Name: N/A    Number of Children: N/A  . Years of Education: N/A   Occupational History  . Not on file.   Social History Main Topics  . Smoking status: Former Smoker    Quit date: 06/18/2005  . Smokeless tobacco: Never Used  . Alcohol Use: Yes     Comment: occasionally  . Drug Use: No  . Sexual Activity: Not on file   Other Topics Concern  . Not on file   Social History Narrative  . No narrative on file    Past Surgical History  Procedure Laterality Date  . Surgery elbow bilaterally    . Tonsillectomy  1948  . Colonsocopy      declines    Family History  Problem Relation Age of Onset  . Heart attack Mother   . Hypertension Father   . Diabetes Father   . Hypertension Brother   . Hypertension Brother   . Hypertension Sister   . Hypertension Sister   . Hypertension Sister     No Known Allergies  Current Outpatient Prescriptions on File Prior to Visit  Medication Sig Dispense Refill  . aspirin 81 MG tablet Take 81 mg by mouth daily.       No current facility-administered medications on file prior to visit.    BP 148/88  Pulse 90  Temp(Src) 97.5 F (36.4 C) (Oral)  Resp 20  Ht 6' 0.5" (1.842 m)  Wt 250 lb (113.399 kg)  BMI 33.42 kg/m2  SpO2 95%     Review of Systems  Constitutional: Negative for fever, chills, appetite change and fatigue.  HENT: Negative for congestion, dental problem, ear pain, hearing loss, sore throat, tinnitus,  trouble swallowing and voice change.   Eyes: Negative for pain, discharge and visual disturbance.  Respiratory: Negative for cough, chest tightness, wheezing and stridor.   Cardiovascular: Negative for chest pain, palpitations and leg swelling.  Gastrointestinal: Negative for nausea, vomiting, abdominal pain, diarrhea, constipation, blood in stool and abdominal distention.  Genitourinary: Negative for urgency, hematuria, flank pain, discharge, difficulty urinating and genital sores.  Musculoskeletal: Negative for arthralgias, back pain, gait problem, joint swelling, myalgias and neck stiffness.  Skin: Negative for rash.  Neurological: Negative for dizziness, syncope, speech difficulty, weakness, numbness and headaches.  Hematological: Negative for adenopathy. Does not bruise/bleed easily.  Psychiatric/Behavioral: Negative for behavioral problems and dysphoric mood. The patient is not nervous/anxious.        Objective:   Physical Exam  Constitutional: He is oriented to person, place, and time. He appears well-developed.  Repeat blood pressure 122/76  HENT:  Head: Normocephalic.  Right Ear: External ear normal.  Left Ear: External ear normal.  Eyes: Conjunctivae and EOM are normal.  Neck: Normal range of motion.  Cardiovascular: Normal rate and normal heart sounds.   Pulmonary/Chest: Breath sounds normal.  Abdominal: Bowel sounds are normal.  Musculoskeletal: Normal range of motion. He  exhibits no edema and no tenderness.  Neurological: He is alert and oriented to person, place, and time.  Psychiatric: He has a normal mood and affect. His behavior is normal.          Assessment & Plan:   Hypertension stable Dyslipidemia.  Continue simvastatin   Schedule CPX with lab

## 2014-05-18 ENCOUNTER — Encounter: Payer: Self-pay | Admitting: Internal Medicine

## 2014-05-18 ENCOUNTER — Ambulatory Visit (INDEPENDENT_AMBULATORY_CARE_PROVIDER_SITE_OTHER): Payer: Commercial Managed Care - HMO | Admitting: Internal Medicine

## 2014-05-18 VITALS — BP 150/90 | HR 76 | Temp 98.1°F | Resp 20 | Ht 72.5 in | Wt 239.0 lb

## 2014-05-18 DIAGNOSIS — I10 Essential (primary) hypertension: Secondary | ICD-10-CM

## 2014-05-18 DIAGNOSIS — R010 Benign and innocent cardiac murmurs: Secondary | ICD-10-CM

## 2014-05-18 DIAGNOSIS — R011 Cardiac murmur, unspecified: Secondary | ICD-10-CM

## 2014-05-18 DIAGNOSIS — E785 Hyperlipidemia, unspecified: Secondary | ICD-10-CM

## 2014-05-18 DIAGNOSIS — K219 Gastro-esophageal reflux disease without esophagitis: Secondary | ICD-10-CM

## 2014-05-18 LAB — CBC WITH DIFFERENTIAL/PLATELET
BASOS ABS: 0 10*3/uL (ref 0.0–0.1)
Basophils Relative: 0.3 % (ref 0.0–3.0)
Eosinophils Absolute: 0.1 10*3/uL (ref 0.0–0.7)
Eosinophils Relative: 1.4 % (ref 0.0–5.0)
HEMATOCRIT: 50 % (ref 39.0–52.0)
Hemoglobin: 16.4 g/dL (ref 13.0–17.0)
LYMPHS ABS: 2.5 10*3/uL (ref 0.7–4.0)
Lymphocytes Relative: 31.3 % (ref 12.0–46.0)
MCHC: 32.8 g/dL (ref 30.0–36.0)
MCV: 92 fl (ref 78.0–100.0)
MONO ABS: 0.7 10*3/uL (ref 0.1–1.0)
MONOS PCT: 9.2 % (ref 3.0–12.0)
NEUTROS PCT: 57.8 % (ref 43.0–77.0)
Neutro Abs: 4.5 10*3/uL (ref 1.4–7.7)
PLATELETS: 251 10*3/uL (ref 150.0–400.0)
RBC: 5.43 Mil/uL (ref 4.22–5.81)
RDW: 14.1 % (ref 11.5–15.5)
WBC: 7.9 10*3/uL (ref 4.0–10.5)

## 2014-05-18 LAB — TSH: TSH: 2.26 u[IU]/mL (ref 0.35–4.50)

## 2014-05-18 MED ORDER — SIMVASTATIN 20 MG PO TABS: 20.0000 mg | ORAL_TABLET | Freq: Every day | ORAL | Status: DC

## 2014-05-18 MED ORDER — AMLODIPINE BESYLATE 10 MG PO TABS: 10.0000 mg | ORAL_TABLET | Freq: Every day | ORAL | Status: DC

## 2014-05-18 MED ORDER — HYDROCHLOROTHIAZIDE 25 MG PO TABS: 25.0000 mg | ORAL_TABLET | Freq: Every day | ORAL | Status: DC

## 2014-05-18 MED ORDER — RAMIPRIL 10 MG PO CAPS: 10.0000 mg | ORAL_CAPSULE | Freq: Every day | ORAL | Status: DC

## 2014-05-18 NOTE — Progress Notes (Signed)
Subjective:    Patient ID: Willie Smith, male    DOB: 1941-04-04, 73 y.o.   MRN: [id number]  HPI Wt Readings from Last 3 Encounters:  05/18/14 239 lb (108.41 kg)  02/10/14 250 lb (113.399 kg)  04/21/13 257 lb (116.31574 kg)   73 year old patient who is seen today for follow-up.  He has a history of hypertension and dyslipidemia.  Doing quite well.  There has been some weight loss.  He remains off tobacco products.  Past Medical History  Diagnosis Date  . Hyperlipidemia   . Hypertension   . Systolic murmur   . GERD (gastroesophageal reflux disease)   . Hemorrhoids     History   Social History  . Marital Status: Single    Spouse Name: N/A    Number of Children: N/A  . Years of Education: N/A   Occupational History  . Not on file.   Social History Main Topics  . Smoking status: Former Smoker    Quit date: 06/18/2005  . Smokeless tobacco: Never Used  . Alcohol Use: Yes     Comment: occasionally  . Drug Use: No  . Sexual Activity: Not on file   Other Topics Concern  . Not on file   Social History Narrative    Past Surgical History  Procedure Laterality Date  . Surgery elbow bilaterally    . Tonsillectomy  1948  . Colonsocopy      declines    Family History  Problem Relation Age of Onset  . Heart attack Mother   . Hypertension Father   . Diabetes Father   . Hypertension Brother   . Hypertension Brother   . Hypertension Sister   . Hypertension Sister   . Hypertension Sister     No Known Allergies  Current Outpatient Prescriptions on File Prior to Visit  Medication Sig Dispense Refill  . aspirin 81 MG tablet Take 81 mg by mouth daily.     No current facility-administered medications on file prior to visit.    BP 150/90 mmHg  Pulse 76  Temp(Src) 98.1 F (36.7 C) (Oral)  Resp 20  Ht 6' 0.5" (1.842 m)  Wt 239 lb (108.41 kg)  BMI 31.95 kg/m2  SpO2 97%     Review of Systems  Constitutional: Positive for unexpected weight change. Negative  for fever, chills, appetite change and fatigue.  HENT: Negative for congestion, dental problem, ear pain, hearing loss, sore throat, tinnitus, trouble swallowing and voice change.   Eyes: Negative for pain, discharge and visual disturbance.  Respiratory: Negative for cough, chest tightness, wheezing and stridor.   Cardiovascular: Negative for chest pain, palpitations and leg swelling.  Gastrointestinal: Negative for nausea, vomiting, abdominal pain, diarrhea, constipation, blood in stool and abdominal distention.  Genitourinary: Negative for urgency, hematuria, flank pain, discharge, difficulty urinating and genital sores.  Musculoskeletal: Negative for myalgias, back pain, joint swelling, arthralgias, gait problem and neck stiffness.  Skin: Negative for rash.  Neurological: Negative for dizziness, syncope, speech difficulty, weakness, numbness and headaches.  Hematological: Negative for adenopathy. Does not bruise/bleed easily.  Psychiatric/Behavioral: Negative for behavioral problems and dysphoric mood. The patient is not nervous/anxious.        Objective:   Physical Exam  Constitutional: He is oriented to person, place, and time. He appears well-developed.  Blood pressure 140/84 in both arms  HENT:  Head: Normocephalic.  Right Ear: External ear normal.  Left Ear: External ear normal.  Eyes: Conjunctivae and EOM are normal.  Neck:  Normal range of motion.  Cardiovascular: Normal rate and normal heart sounds.   Pulmonary/Chest: Breath sounds normal.  Abdominal: Bowel sounds are normal.  Musculoskeletal: Normal range of motion. He exhibits no edema or tenderness.  Neurological: He is alert and oriented to person, place, and time.  Psychiatric: He has a normal mood and affect. His behavior is normal.          Assessment & Plan:   Hypertension.  Continue triple therapy.  Additional weight loss encouraged Dyslipidemia.  Will check lipid profile  CPX 6 months

## 2014-05-18 NOTE — Patient Instructions (Signed)
Limit your sodium (Salt) intake    It is important that you exercise regularly, at least 20 minutes 3 to 4 times per week.  If you develop chest pain or shortness of breath seek  medical attention.  You need to lose weight.  Consider a lower calorie diet and regular exercise.  Return in 6 months for follow-up   

## 2014-05-18 NOTE — Progress Notes (Signed)
Pre visit review using our clinic review tool, if applicable. No additional management support is needed unless otherwise documented below in the visit note. 

## 2014-05-19 LAB — LIPID PANEL
CHOLESTEROL: 158 mg/dL (ref 0–200)
HDL: 26.4 mg/dL — ABNORMAL LOW (ref >39.00)
LDL Cholesterol: 102 mg/dL — ABNORMAL HIGH (ref 0–99)
NONHDL: 131.6
Total CHOL/HDL Ratio: 6
Triglycerides: 150 mg/dL — ABNORMAL HIGH (ref 0.0–149.0)
VLDL: 30 mg/dL (ref 0.0–40.0)

## 2014-05-19 LAB — COMPREHENSIVE METABOLIC PANEL
ALBUMIN: 4.4 g/dL (ref 3.5–5.2)
ALT: 52 U/L (ref 0–53)
AST: 32 U/L (ref 0–37)
Alkaline Phosphatase: 72 U/L (ref 39–117)
BILIRUBIN TOTAL: 0.8 mg/dL (ref 0.2–1.2)
BUN: 17 mg/dL (ref 6–23)
CALCIUM: 9.5 mg/dL (ref 8.4–10.5)
CHLORIDE: 102 meq/L (ref 96–112)
CO2: 24 meq/L (ref 19–32)
Creatinine, Ser: 1.4 mg/dL (ref 0.4–1.5)
GFR: 52.03 mL/min — AB (ref >60.00)
GLUCOSE: 116 mg/dL — AB (ref 70–99)
POTASSIUM: 4.5 meq/L (ref 3.5–5.1)
SODIUM: 136 meq/L (ref 135–145)
TOTAL PROTEIN: 7.7 g/dL (ref 6.0–8.3)

## 2014-05-20 ENCOUNTER — Telehealth: Payer: Self-pay | Admitting: Internal Medicine

## 2014-05-20 NOTE — Telephone Encounter (Signed)
emmi mailed  °

## 2014-08-25 DIAGNOSIS — E780 Pure hypercholesterolemia: Secondary | ICD-10-CM | POA: Diagnosis not present

## 2014-08-25 DIAGNOSIS — I2510 Atherosclerotic heart disease of native coronary artery without angina pectoris: Secondary | ICD-10-CM | POA: Diagnosis not present

## 2014-08-25 DIAGNOSIS — Z23 Encounter for immunization: Secondary | ICD-10-CM | POA: Diagnosis not present

## 2014-08-25 DIAGNOSIS — I10 Essential (primary) hypertension: Secondary | ICD-10-CM | POA: Diagnosis not present

## 2014-08-25 DIAGNOSIS — R972 Elevated prostate specific antigen [PSA]: Secondary | ICD-10-CM | POA: Diagnosis not present

## 2014-08-25 DIAGNOSIS — I252 Old myocardial infarction: Secondary | ICD-10-CM | POA: Diagnosis not present

## 2014-08-31 DIAGNOSIS — M4806 Spinal stenosis, lumbar region: Secondary | ICD-10-CM | POA: Diagnosis not present

## 2014-10-22 DIAGNOSIS — G4700 Insomnia, unspecified: Secondary | ICD-10-CM | POA: Diagnosis not present

## 2014-10-22 DIAGNOSIS — J309 Allergic rhinitis, unspecified: Secondary | ICD-10-CM | POA: Diagnosis not present

## 2014-10-22 DIAGNOSIS — Z0000 Encounter for general adult medical examination without abnormal findings: Secondary | ICD-10-CM | POA: Diagnosis not present

## 2014-10-22 DIAGNOSIS — E782 Mixed hyperlipidemia: Secondary | ICD-10-CM | POA: Diagnosis not present

## 2014-10-22 DIAGNOSIS — I4891 Unspecified atrial fibrillation: Secondary | ICD-10-CM | POA: Diagnosis not present

## 2014-10-22 DIAGNOSIS — E119 Type 2 diabetes mellitus without complications: Secondary | ICD-10-CM | POA: Diagnosis not present

## 2014-10-22 DIAGNOSIS — R609 Edema, unspecified: Secondary | ICD-10-CM | POA: Diagnosis not present

## 2014-11-01 DIAGNOSIS — M545 Low back pain: Secondary | ICD-10-CM | POA: Diagnosis not present

## 2014-11-01 DIAGNOSIS — R52 Pain, unspecified: Secondary | ICD-10-CM | POA: Diagnosis not present

## 2014-11-11 DIAGNOSIS — Z961 Presence of intraocular lens: Secondary | ICD-10-CM | POA: Diagnosis not present

## 2014-11-11 DIAGNOSIS — E119 Type 2 diabetes mellitus without complications: Secondary | ICD-10-CM | POA: Diagnosis not present

## 2014-11-16 ENCOUNTER — Other Ambulatory Visit: Payer: Self-pay | Admitting: *Deleted

## 2014-11-16 ENCOUNTER — Encounter: Payer: Self-pay | Admitting: Internal Medicine

## 2014-11-16 ENCOUNTER — Ambulatory Visit (INDEPENDENT_AMBULATORY_CARE_PROVIDER_SITE_OTHER): Payer: Commercial Managed Care - HMO | Admitting: Internal Medicine

## 2014-11-16 VITALS — BP 110/72 | HR 94 | Temp 98.0°F | Resp 20 | Ht 72.5 in | Wt 240.0 lb

## 2014-11-16 DIAGNOSIS — I10 Essential (primary) hypertension: Secondary | ICD-10-CM | POA: Diagnosis not present

## 2014-11-16 DIAGNOSIS — N400 Enlarged prostate without lower urinary tract symptoms: Secondary | ICD-10-CM

## 2014-11-16 MED ORDER — AMLODIPINE BESYLATE 10 MG PO TABS: 10.0000 mg | ORAL_TABLET | Freq: Every day | ORAL | Status: DC

## 2014-11-16 MED ORDER — SIMVASTATIN 20 MG PO TABS: 20.0000 mg | ORAL_TABLET | Freq: Every day | ORAL | Status: DC

## 2014-11-16 MED ORDER — RAMIPRIL 10 MG PO CAPS: 10.0000 mg | ORAL_CAPSULE | Freq: Every day | ORAL | Status: DC

## 2014-11-16 MED ORDER — HYDROCHLOROTHIAZIDE 25 MG PO TABS: 25.0000 mg | ORAL_TABLET | Freq: Every day | ORAL | Status: DC

## 2014-11-16 NOTE — Patient Instructions (Signed)
Please call if you develop any new or worsening symptoms..  Annual exam as scheduled

## 2014-11-16 NOTE — Progress Notes (Signed)
Subjective:    Patient ID: Willie Smith, male    DOB: Apr 26, 1941, 74 y.o.   MRN: [id number]  HPI 74 year old patient who presents with a chief complaint of prostate difficulties.  For the past 3 or 4 weeks he has noted slight decreased urinary stream and some increased frequency.  He has nocturia times 1, which is a new finding.  Symptoms are improving and started at recovering from an episode of acute gastroenteritis.  Past Medical History  Diagnosis Date  . Hyperlipidemia   . Hypertension   . Systolic murmur   . GERD (gastroesophageal reflux disease)   . Hemorrhoids     History   Social History  . Marital Status: Single    Spouse Name: N/A  . Number of Children: N/A  . Years of Education: N/A   Occupational History  . Not on file.   Social History Main Topics  . Smoking status: Former Smoker    Quit date: 06/18/2005  . Smokeless tobacco: Never Used  . Alcohol Use: Yes     Comment: occasionally  . Drug Use: No  . Sexual Activity: Not on file   Other Topics Concern  . Not on file   Social History Narrative    Past Surgical History  Procedure Laterality Date  . Surgery elbow bilaterally    . Tonsillectomy  1948  . Colonsocopy      declines    Family History  Problem Relation Age of Onset  . Heart attack Mother   . Hypertension Father   . Diabetes Father   . Hypertension Brother   . Hypertension Brother   . Hypertension Sister   . Hypertension Sister   . Hypertension Sister     No Known Allergies  Current Outpatient Prescriptions on File Prior to Visit  Medication Sig Dispense Refill  . aspirin 81 MG tablet Take 81 mg by mouth daily.     No current facility-administered medications on file prior to visit.    BP 110/72 mmHg  Pulse 94  Temp(Src) 98 F (36.7 C) (Oral)  Resp 20  Ht 6' 0.5" (1.842 m)  Wt 240 lb (108.863 kg)  BMI 32.08 kg/m2  SpO2 96%      Review of Systems  Constitutional: Negative for fever, chills, appetite change  and fatigue.  HENT: Negative for congestion, dental problem, ear pain, hearing loss, sore throat, tinnitus, trouble swallowing and voice change.   Eyes: Negative for pain, discharge and visual disturbance.  Respiratory: Negative for cough, chest tightness, wheezing and stridor.   Cardiovascular: Negative for chest pain, palpitations and leg swelling.  Gastrointestinal: Negative for nausea, vomiting, abdominal pain, diarrhea, constipation, blood in stool and abdominal distention.  Genitourinary: Positive for frequency, decreased urine volume and difficulty urinating. Negative for dysuria, urgency, hematuria, flank pain, discharge and genital sores.  Musculoskeletal: Negative for myalgias, back pain, joint swelling, arthralgias, gait problem and neck stiffness.  Skin: Negative for rash.  Neurological: Negative for dizziness, syncope, speech difficulty, weakness, numbness and headaches.  Hematological: Negative for adenopathy. Does not bruise/bleed easily.  Psychiatric/Behavioral: Negative for behavioral problems and dysphoric mood. The patient is not nervous/anxious.        Objective:   Physical Exam  Constitutional: He appears well-developed. No distress.  Genitourinary:  Prostate  minimally enlarged and nontender          Assessment & Plan:   Mild BPH.  Symptoms improving.  We'll observe at the present time.  Will call the office if  he develops any new or worsening symptoms Hypertension, controlled

## 2014-11-16 NOTE — Progress Notes (Signed)
Pre visit review using our clinic review tool, if applicable. No additional management support is needed unless otherwise documented below in the visit note. 

## 2014-11-30 DIAGNOSIS — M542 Cervicalgia: Secondary | ICD-10-CM | POA: Diagnosis not present

## 2014-12-01 DIAGNOSIS — N390 Urinary tract infection, site not specified: Secondary | ICD-10-CM | POA: Diagnosis not present

## 2014-12-21 DIAGNOSIS — K51914 Ulcerative colitis, unspecified with abscess: Secondary | ICD-10-CM | POA: Diagnosis not present

## 2014-12-21 DIAGNOSIS — E46 Unspecified protein-calorie malnutrition: Secondary | ICD-10-CM | POA: Diagnosis not present

## 2014-12-21 DIAGNOSIS — A419 Sepsis, unspecified organism: Secondary | ICD-10-CM | POA: Diagnosis not present

## 2014-12-21 DIAGNOSIS — G9340 Encephalopathy, unspecified: Secondary | ICD-10-CM | POA: Diagnosis not present

## 2014-12-23 DIAGNOSIS — Z87440 Personal history of urinary (tract) infections: Secondary | ICD-10-CM | POA: Diagnosis not present

## 2014-12-23 DIAGNOSIS — R339 Retention of urine, unspecified: Secondary | ICD-10-CM | POA: Diagnosis not present

## 2014-12-23 DIAGNOSIS — Z466 Encounter for fitting and adjustment of urinary device: Secondary | ICD-10-CM | POA: Diagnosis not present

## 2014-12-31 DIAGNOSIS — I10 Essential (primary) hypertension: Secondary | ICD-10-CM | POA: Diagnosis not present

## 2014-12-31 DIAGNOSIS — N183 Chronic kidney disease, stage 3 (moderate): Secondary | ICD-10-CM | POA: Diagnosis not present

## 2014-12-31 DIAGNOSIS — N390 Urinary tract infection, site not specified: Secondary | ICD-10-CM | POA: Diagnosis not present

## 2014-12-31 DIAGNOSIS — E871 Hypo-osmolality and hyponatremia: Secondary | ICD-10-CM | POA: Diagnosis not present

## 2015-01-03 ENCOUNTER — Encounter: Payer: Self-pay | Admitting: Internal Medicine

## 2015-01-03 ENCOUNTER — Ambulatory Visit (INDEPENDENT_AMBULATORY_CARE_PROVIDER_SITE_OTHER): Payer: Commercial Managed Care - HMO | Admitting: Internal Medicine

## 2015-01-03 VITALS — BP 160/86 | HR 86 | Temp 98.4°F | Resp 20 | Ht 72.5 in | Wt 245.0 lb

## 2015-01-03 DIAGNOSIS — I10 Essential (primary) hypertension: Secondary | ICD-10-CM | POA: Diagnosis not present

## 2015-01-03 DIAGNOSIS — L723 Sebaceous cyst: Secondary | ICD-10-CM | POA: Diagnosis not present

## 2015-01-03 DIAGNOSIS — L089 Local infection of the skin and subcutaneous tissue, unspecified: Secondary | ICD-10-CM

## 2015-01-03 MED ORDER — AMOXICILLIN-POT CLAVULANATE 875-125 MG PO TABS: 1.0000 | ORAL_TABLET | Freq: Two times a day (BID) | ORAL | Status: DC

## 2015-01-03 NOTE — Progress Notes (Signed)
Pre visit review using our clinic review tool, if applicable. No additional management support is needed unless otherwise documented below in the visit note. 

## 2015-01-03 NOTE — Progress Notes (Signed)
Subjective:    Patient ID: Willie Smith, male    DOB: [birth year], 74 y.o.   MRN: [id number]  HPI  74 year old patient seen with a several day history of a large seen.  Painful area in the upper mid back area.  This has been draining intermittently at times.  There has been some improvement in pain with spontaneous drainage, but the patient has remained quite uncomfortable. No fever, chills or systemic complaints No history of allergies  Past Medical History  Diagnosis Date  . Hyperlipidemia   . Hypertension   . Systolic murmur   . GERD (gastroesophageal reflux disease)   . Hemorrhoids     History   Social History  . Marital Status: Single    Spouse Name: N/A  . Number of Children: N/A  . Years of Education: N/A   Occupational History  . Not on file.   Social History Main Topics  . Smoking status: Former Smoker    Quit date: 06/18/2005  . Smokeless tobacco: Never Used  . Alcohol Use: Yes     Comment: occasionally  . Drug Use: No  . Sexual Activity: Not on file   Other Topics Concern  . Not on file   Social History Narrative    Past Surgical History  Procedure Laterality Date  . Surgery elbow bilaterally    . Tonsillectomy  1948  . Colonsocopy      declines    Family History  Problem Relation Age of Onset  . Heart attack Mother   . Hypertension Father   . Diabetes Father   . Hypertension Brother   . Hypertension Brother   . Hypertension Sister   . Hypertension Sister   . Hypertension Sister     No Known Allergies  Current Outpatient Prescriptions on File Prior to Visit  Medication Sig Dispense Refill  . amLODipine (NORVASC) 10 MG tablet Take 1 tablet (10 mg total) by mouth daily. 90 tablet 1  . aspirin 81 MG tablet Take 81 mg by mouth daily.    . hydrochlorothiazide (HYDRODIURIL) 25 MG tablet Take 1 tablet (25 mg total) by mouth daily. 90 tablet 1  . ramipril (ALTACE) 10 MG capsule Take 1 capsule (10 mg total) by mouth daily. 90 capsule 1  .  simvastatin (ZOCOR) 20 MG tablet Take 1 tablet (20 mg total) by mouth at bedtime. 90 tablet 1   No current facility-administered medications on file prior to visit.    BP 160/86 mmHg  Pulse 86  Temp(Src) 98.4 F (36.9 C) (Oral)  Resp 20  Ht 6' 0.5" (1.842 m)  Wt 245 lb (111.131 kg)  BMI 32.75 kg/m2  SpO2 97%     Review of Systems  Constitutional: Negative for fever, chills, appetite change and fatigue.  HENT: Negative for congestion, dental problem, ear pain, hearing loss, sore throat, tinnitus, trouble swallowing and voice change.   Eyes: Negative for pain, discharge and visual disturbance.  Respiratory: Negative for cough, chest tightness, wheezing and stridor.   Cardiovascular: Negative for chest pain, palpitations and leg swelling.  Gastrointestinal: Positive for blood in stool. Negative for nausea, vomiting, abdominal pain, diarrhea, constipation and abdominal distention.  Genitourinary: Negative for urgency, hematuria, flank pain, discharge, difficulty urinating and genital sores.  Musculoskeletal: Negative for myalgias, back pain, joint swelling, arthralgias, gait problem and neck stiffness.  Skin: Positive for rash and wound.  Neurological: Negative for dizziness, syncope, speech difficulty, weakness, numbness and headaches.  Hematological: Negative for adenopathy. Does not bruise/bleed  easily.  Psychiatric/Behavioral: Negative for behavioral problems and dysphoric mood. The patient is not nervous/anxious.        Objective:   Physical Exam  Constitutional: He appears well-developed and well-nourished. No distress.  Skin:  A large 6 x 10 cm area of erythema, tenderness and induration with some scattered areas of fluctuance over the upper mid back Some spontaneous drainage noted          Assessment & Plan:   Sebaceous cyst with some surrounding cellulitis.  Procedure note.  After informed consent, and skin preparation with Betadine and alcohol swab, the lesion  was anesthetized with 1% Xylocaine.  3.  Small incisions were made and exudative material was evacuated. Topical antibiotic ointment applied and the wound dressed  Local wound care discussed Patient placed on oral antibiotic therapy  We'll report any clinical worsening.  Patient aware that this may require further I&D

## 2015-01-03 NOTE — Patient Instructions (Signed)
Epidermal Cyst An epidermal cyst is sometimes called a sebaceous cyst, epidermal inclusion cyst, or infundibular cyst. These cysts usually contain a substance that looks "pasty" or "cheesy" and may have a bad smell. This substance is a protein called keratin. Epidermal cysts are usually found on the face, neck, or trunk. They may also occur in the vaginal area or other parts of the genitalia of both men and women. Epidermal cysts are usually small, painless, slow-growing bumps or lumps that move freely under the skin. It is important not to try to pop them. This may cause an infection and lead to tenderness and swelling. CAUSES  Epidermal cysts may be caused by a deep penetrating injury to the skin or a plugged hair follicle, often associated with acne. SYMPTOMS  Epidermal cysts can become inflamed and cause:  Redness.  Tenderness.  Increased temperature of the skin over the bumps or lumps.  Grayish-white, bad smelling material that drains from the bump or lump. DIAGNOSIS  Epidermal cysts are easily diagnosed by your caregiver during an exam. Rarely, a tissue sample (biopsy) may be taken to rule out other conditions that may resemble epidermal cysts. TREATMENT   Epidermal cysts often get better and disappear on their own. They are rarely ever cancerous.  If a cyst becomes infected, it may become inflamed and tender. This may require opening and draining the cyst. Treatment with antibiotics may be necessary. When the infection is gone, the cyst may be removed with minor surgery.  Small, inflamed cysts can often be treated with antibiotics or by injecting steroid medicines.  Sometimes, epidermal cysts become large and bothersome. If this happens, surgical removal in your caregiver's office may be necessary. HOME CARE INSTRUCTIONS  Only take over-the-counter or prescription medicines as directed by your caregiver.  Take your antibiotics as directed. Finish them even if you start to feel  better. SEEK MEDICAL CARE IF:   Your cyst becomes tender, red, or swollen.  Your condition is not improving or is getting worse.  You have any other questions or concerns. MAKE SURE YOU:  Understand these instructions.  Will watch your condition.  Will get help right away if you are not doing well or get worse. Document Released: 05/05/2004 Document Revised: 08/27/2011 Document Reviewed: 12/11/2010 [employer] Patient Information 2015 [employer]. This information is not intended to replace advice given to you by your health care provider. Make sure you discuss any questions you have with your health care provider.  

## 2015-01-12 DIAGNOSIS — K51914 Ulcerative colitis, unspecified with abscess: Secondary | ICD-10-CM | POA: Diagnosis not present

## 2015-01-12 DIAGNOSIS — G9340 Encephalopathy, unspecified: Secondary | ICD-10-CM | POA: Diagnosis not present

## 2015-01-12 DIAGNOSIS — A419 Sepsis, unspecified organism: Secondary | ICD-10-CM | POA: Diagnosis not present

## 2015-01-12 DIAGNOSIS — E46 Unspecified protein-calorie malnutrition: Secondary | ICD-10-CM | POA: Diagnosis not present

## 2015-02-21 DIAGNOSIS — J449 Chronic obstructive pulmonary disease, unspecified: Secondary | ICD-10-CM | POA: Diagnosis not present

## 2015-03-01 DIAGNOSIS — E1122 Type 2 diabetes mellitus with diabetic chronic kidney disease: Secondary | ICD-10-CM | POA: Diagnosis not present

## 2015-03-01 DIAGNOSIS — M5431 Sciatica, right side: Secondary | ICD-10-CM | POA: Diagnosis not present

## 2015-03-01 DIAGNOSIS — Z79899 Other long term (current) drug therapy: Secondary | ICD-10-CM | POA: Diagnosis not present

## 2015-03-01 DIAGNOSIS — I129 Hypertensive chronic kidney disease with stage 1 through stage 4 chronic kidney disease, or unspecified chronic kidney disease: Secondary | ICD-10-CM | POA: Diagnosis not present

## 2015-03-03 DIAGNOSIS — R972 Elevated prostate specific antigen [PSA]: Secondary | ICD-10-CM | POA: Diagnosis not present

## 2015-03-09 DIAGNOSIS — M791 Myalgia: Secondary | ICD-10-CM | POA: Diagnosis not present

## 2015-03-15 DIAGNOSIS — I10 Essential (primary) hypertension: Secondary | ICD-10-CM | POA: Diagnosis not present

## 2015-03-15 DIAGNOSIS — Z6829 Body mass index (BMI) 29.0-29.9, adult: Secondary | ICD-10-CM | POA: Diagnosis not present

## 2015-03-15 DIAGNOSIS — R0600 Dyspnea, unspecified: Secondary | ICD-10-CM | POA: Diagnosis not present

## 2015-03-15 DIAGNOSIS — Z1389 Encounter for screening for other disorder: Secondary | ICD-10-CM | POA: Diagnosis not present

## 2015-04-08 DIAGNOSIS — J189 Pneumonia, unspecified organism: Secondary | ICD-10-CM | POA: Diagnosis not present

## 2015-04-08 DIAGNOSIS — E119 Type 2 diabetes mellitus without complications: Secondary | ICD-10-CM | POA: Diagnosis not present

## 2015-04-08 DIAGNOSIS — N183 Chronic kidney disease, stage 3 (moderate): Secondary | ICD-10-CM | POA: Diagnosis not present

## 2015-04-22 DIAGNOSIS — E11319 Type 2 diabetes mellitus with unspecified diabetic retinopathy without macular edema: Secondary | ICD-10-CM | POA: Diagnosis not present

## 2015-04-22 DIAGNOSIS — I10 Essential (primary) hypertension: Secondary | ICD-10-CM | POA: Diagnosis not present

## 2015-04-22 DIAGNOSIS — E782 Mixed hyperlipidemia: Secondary | ICD-10-CM | POA: Diagnosis not present

## 2015-04-22 DIAGNOSIS — M159 Polyosteoarthritis, unspecified: Secondary | ICD-10-CM | POA: Diagnosis not present

## 2015-05-17 ENCOUNTER — Telehealth: Payer: Self-pay | Admitting: Internal Medicine

## 2015-05-17 MED ORDER — RAMIPRIL 10 MG PO CAPS: 10.0000 mg | ORAL_CAPSULE | Freq: Every day | ORAL | Status: DC

## 2015-05-17 MED ORDER — AMLODIPINE BESYLATE 10 MG PO TABS: 10.0000 mg | ORAL_TABLET | Freq: Every day | ORAL | Status: DC

## 2015-05-17 MED ORDER — SIMVASTATIN 20 MG PO TABS: 20.0000 mg | ORAL_TABLET | Freq: Every day | ORAL | Status: DC

## 2015-05-17 MED ORDER — HYDROCHLOROTHIAZIDE 25 MG PO TABS: 25.0000 mg | ORAL_TABLET | Freq: Every day | ORAL | Status: DC

## 2015-05-17 NOTE — Telephone Encounter (Signed)
Willie GosselinPatti Smith, the pt's wife called saying he's out of his medication and the pharmacy won't refill them until a phone call is made by [name]. She'd like a phone call back regarding this.   Willie's ph# [phone] Thank you.

## 2015-05-17 NOTE — Telephone Encounter (Signed)
Spoke to pt's wife Elease Hashimotoatricia, told her pt had no refills left Rx's sent to pharmacy. Pt needs to schedule physical. Elease Hashimotoatricia verbalized understanding.

## 2015-08-02 ENCOUNTER — Encounter: Payer: Self-pay | Admitting: Family Medicine

## 2015-08-02 ENCOUNTER — Ambulatory Visit (INDEPENDENT_AMBULATORY_CARE_PROVIDER_SITE_OTHER): Payer: Commercial Managed Care - HMO | Admitting: Family Medicine

## 2015-08-02 VITALS — BP 130/72 | HR 103 | Temp 97.9°F | Ht 72.5 in | Wt 241.1 lb

## 2015-08-02 DIAGNOSIS — K0889 Other specified disorders of teeth and supporting structures: Secondary | ICD-10-CM

## 2015-08-02 MED ORDER — AMOXICILLIN-POT CLAVULANATE 875-125 MG PO TABS: 1.0000 | ORAL_TABLET | Freq: Two times a day (BID) | ORAL | Status: DC

## 2015-08-02 NOTE — Patient Instructions (Addendum)
Start the antibiotic and take twice daily  Call your dentist today and seek dental care in the next 1 week or immediately if worsening, fevers, malaise, trouble eating or other concerns.

## 2015-08-02 NOTE — Progress Notes (Signed)
HPI:  Acute visit for:  Tooth pain: -started: several months ago, worse the last few days -symptoms: pain L lower back molar, swelling of gum, LAD left  -denies:fevers, malaise, nausea, vomiting, tenesmus   ROS: See pertinent positives and negatives per HPI.  Past Medical History  Diagnosis Date  . Hyperlipidemia   . Hypertension   . Systolic murmur   . GERD (gastroesophageal reflux disease)   . Hemorrhoids     Past Surgical History  Procedure Laterality Date  . Surgery elbow bilaterally    . Tonsillectomy  1948  . Colonsocopy      declines    Family History  Problem Relation Age of Onset  . Heart attack Mother   . Hypertension Father   . Diabetes Father   . Hypertension Brother   . Hypertension Brother   . Hypertension Sister   . Hypertension Sister   . Hypertension Sister     Social History   Social History  . Marital Status: Single    Spouse Name: N/A  . Number of Children: N/A  . Years of Education: N/A   Social History Main Topics  . Smoking status: Former Smoker    Quit date: 06/18/2005  . Smokeless tobacco: Never Used  . Alcohol Use: Yes     Comment: occasionally  . Drug Use: No  . Sexual Activity: Not Asked   Other Topics Concern  . None   Social History Narrative     Current outpatient prescriptions:  .  amLODipine (NORVASC) 10 MG tablet, Take 1 tablet (10 mg total) by mouth daily., Disp: 90 tablet, Rfl: 0 .  aspirin 81 MG tablet, Take 81 mg by mouth daily., Disp: , Rfl:  .  hydrochlorothiazide (HYDRODIURIL) 25 MG tablet, Take 1 tablet (25 mg total) by mouth daily., Disp: 90 tablet, Rfl: 0 .  ramipril (ALTACE) 10 MG capsule, Take 1 capsule (10 mg total) by mouth daily., Disp: 90 capsule, Rfl: 0 .  simvastatin (ZOCOR) 20 MG tablet, Take 1 tablet (20 mg total) by mouth at bedtime., Disp: 90 tablet, Rfl: 0 .  amoxicillin-clavulanate (AUGMENTIN) 875-125 MG tablet, Take 1 tablet by mouth 2 (two) times daily., Disp: 10 tablet, Rfl:  0  EXAM:  Filed Vitals:   08/02/15 1028  BP: 130/72  Pulse: 103  Temp: 97.9 F (36.6 C)    Body mass index is 32.23 kg/(m^2).  GENERAL: vitals reviewed and listed above, alert, oriented, appears well hydrated and in no acute distress  HEENT: atraumatic, conjunttiva clear, no obvious abnormalities on inspection of external nose and ears, poor dentition, erythema and mild swelling of gum around back lower molar, tenderness when tapping on this tooth, no pitting or purulence  NECK: no obvious masses on inspection, tender L submental and ant cervical LAD  MS: moves all extremities without noticeable abnormality  PSYCH: pleasant and cooperative, no obvious depression or anxiety  ASSESSMENT AND PLAN:  Discussed the following assessment and plan:  Pain, dental  -advised to see dentist promptly - tx with abx in interim for possible cellulitis of gum and/or abscess - no systemic symptoms, pitting or severe symptoms at this time. OTC analgesics. -Patient advised to return or notify a doctor immediately if symptoms worsen or persist or new concerns arise.  Patient Instructions  Start the antibiotic and take twice daily  Call your dentist today and seek dental care in the next 1 week or immediately if worsening, fevers, malaise, trouble eating or other concerns.  Colin Benton R.

## 2015-08-02 NOTE — Progress Notes (Signed)
Pre visit review using our clinic review tool, if applicable. No additional management support is needed unless otherwise documented below in the visit note. 

## 2015-08-15 ENCOUNTER — Telehealth: Payer: Self-pay | Admitting: Internal Medicine

## 2015-08-15 MED ORDER — SIMVASTATIN 20 MG PO TABS: 20.0000 mg | ORAL_TABLET | Freq: Every day | ORAL | Status: DC

## 2015-08-15 MED ORDER — RAMIPRIL 10 MG PO CAPS: 10.0000 mg | ORAL_CAPSULE | Freq: Every day | ORAL | Status: DC

## 2015-08-15 MED ORDER — AMLODIPINE BESYLATE 10 MG PO TABS: 10.0000 mg | ORAL_TABLET | Freq: Every day | ORAL | Status: DC

## 2015-08-15 MED ORDER — HYDROCHLOROTHIAZIDE 25 MG PO TABS: 25.0000 mg | ORAL_TABLET | Freq: Every day | ORAL | Status: DC

## 2015-08-15 NOTE — Telephone Encounter (Signed)
Rx's sent to pharmacy.  

## 2015-08-15 NOTE — Telephone Encounter (Signed)
Willie Smith called to say they are on the way to [place] , their grandson is having open heart surgery. Pt left his meds at home. She would like to know if you can send 30 day to [facility] [address], [place].  amLODipine (NORVASC) 10 MG tablet hydrochlorothiazide (HYDRODIURIL) 25 MG tablet ramipril (ALTACE) 10 MG capsule simvastatin (ZOCOR) 20 MG tablet

## 2015-09-19 ENCOUNTER — Emergency Department (HOSPITAL_COMMUNITY)
Admission: EM | Admit: 2015-09-19 | Discharge: 2015-09-19 | Disposition: A | Discharge status: Home / Self Care | Payer: Commercial Managed Care - HMO | Attending: Emergency Medicine | Admitting: Emergency Medicine

## 2015-09-19 ENCOUNTER — Encounter: Payer: Self-pay | Admitting: Internal Medicine

## 2015-09-19 ENCOUNTER — Ambulatory Visit (INDEPENDENT_AMBULATORY_CARE_PROVIDER_SITE_OTHER): Payer: Commercial Managed Care - HMO | Admitting: Internal Medicine

## 2015-09-19 ENCOUNTER — Emergency Department (HOSPITAL_COMMUNITY): Payer: Commercial Managed Care - HMO

## 2015-09-19 ENCOUNTER — Encounter (HOSPITAL_COMMUNITY): Payer: Self-pay | Admitting: *Deleted

## 2015-09-19 VITALS — BP 130/74 | HR 84 | Temp 98.5°F | Resp 20 | Ht 72.5 in | Wt 250.0 lb

## 2015-09-19 DIAGNOSIS — W010XXA Fall on same level from slipping, tripping and stumbling without subsequent striking against object, initial encounter: Secondary | ICD-10-CM

## 2015-09-19 DIAGNOSIS — S2241XA Multiple fractures of ribs, right side, initial encounter for closed fracture: Secondary | ICD-10-CM | POA: Diagnosis not present

## 2015-09-19 DIAGNOSIS — R011 Cardiac murmur, unspecified: Secondary | ICD-10-CM | POA: Insufficient documentation

## 2015-09-19 DIAGNOSIS — Z8719 Personal history of other diseases of the digestive system: Secondary | ICD-10-CM | POA: Insufficient documentation

## 2015-09-19 DIAGNOSIS — I10 Essential (primary) hypertension: Secondary | ICD-10-CM | POA: Diagnosis not present

## 2015-09-19 DIAGNOSIS — E785 Hyperlipidemia, unspecified: Secondary | ICD-10-CM | POA: Diagnosis not present

## 2015-09-19 DIAGNOSIS — R0789 Other chest pain: Secondary | ICD-10-CM | POA: Diagnosis not present

## 2015-09-19 DIAGNOSIS — Z87891 Personal history of nicotine dependence: Secondary | ICD-10-CM | POA: Diagnosis not present

## 2015-09-19 DIAGNOSIS — Z79899 Other long term (current) drug therapy: Secondary | ICD-10-CM | POA: Diagnosis not present

## 2015-09-19 DIAGNOSIS — Y9289 Other specified places as the place of occurrence of the external cause: Secondary | ICD-10-CM | POA: Diagnosis not present

## 2015-09-19 DIAGNOSIS — S270XXA Traumatic pneumothorax, initial encounter: Secondary | ICD-10-CM | POA: Diagnosis not present

## 2015-09-19 DIAGNOSIS — Y998 Other external cause status: Secondary | ICD-10-CM | POA: Insufficient documentation

## 2015-09-19 DIAGNOSIS — S2239XA Fracture of one rib, unspecified side, initial encounter for closed fracture: Secondary | ICD-10-CM | POA: Diagnosis not present

## 2015-09-19 DIAGNOSIS — E119 Type 2 diabetes mellitus without complications: Secondary | ICD-10-CM

## 2015-09-19 DIAGNOSIS — W01198A Fall on same level from slipping, tripping and stumbling with subsequent striking against other object, initial encounter: Secondary | ICD-10-CM | POA: Insufficient documentation

## 2015-09-19 DIAGNOSIS — S29001A Unspecified injury of muscle and tendon of front wall of thorax, initial encounter: Secondary | ICD-10-CM | POA: Diagnosis present

## 2015-09-19 DIAGNOSIS — Y9389 Activity, other specified: Secondary | ICD-10-CM | POA: Diagnosis not present

## 2015-09-19 DIAGNOSIS — R739 Hyperglycemia, unspecified: Secondary | ICD-10-CM

## 2015-09-19 DIAGNOSIS — E1165 Type 2 diabetes mellitus with hyperglycemia: Secondary | ICD-10-CM | POA: Insufficient documentation

## 2015-09-19 DIAGNOSIS — Z7982 Long term (current) use of aspirin: Secondary | ICD-10-CM | POA: Insufficient documentation

## 2015-09-19 LAB — BASIC METABOLIC PANEL
ANION GAP: 13 (ref 5–15)
BUN: 19 mg/dL (ref 6–20)
CALCIUM: 9.7 mg/dL (ref 8.9–10.3)
CO2: 23 mmol/L (ref 22–32)
Chloride: 101 mmol/L (ref 101–111)
Creatinine, Ser: 1.59 mg/dL — ABNORMAL HIGH (ref 0.61–1.24)
GFR, EST AFRICAN AMERICAN: 48 mL/min — AB (ref >60)
GFR, EST NON AFRICAN AMERICAN: 41 mL/min — AB (ref >60)
GLUCOSE: 392 mg/dL — AB (ref 65–99)
POTASSIUM: 4.8 mmol/L (ref 3.5–5.1)
Sodium: 137 mmol/L (ref 135–145)

## 2015-09-19 LAB — CBC
HEMATOCRIT: 48.8 % (ref 39.0–52.0)
Hemoglobin: 16.9 g/dL (ref 13.0–17.0)
MCH: 30.7 pg (ref 26.0–34.0)
MCHC: 34.6 g/dL (ref 30.0–36.0)
MCV: 88.7 fL (ref 78.0–100.0)
Platelets: 230 10*3/uL (ref 150–400)
RBC: 5.5 MIL/uL (ref 4.22–5.81)
RDW: 13.4 % (ref 11.5–15.5)
WBC: 18.7 10*3/uL — AB (ref 4.0–10.5)

## 2015-09-19 LAB — CBG MONITORING, ED: Glucose-Capillary: 324 mg/dL — ABNORMAL HIGH (ref 65–99)

## 2015-09-19 MED ORDER — FENTANYL CITRATE (PF) 100 MCG/2ML IJ SOLN
INTRAMUSCULAR | Status: DC
Start: 2015-09-19 — End: 2015-09-20
  Filled 2015-09-19: qty 2

## 2015-09-19 MED ORDER — HYDROCHLOROTHIAZIDE 25 MG PO TABS: 25.0000 mg | ORAL_TABLET | Freq: Every day | ORAL | Status: DC

## 2015-09-19 MED ORDER — SODIUM CHLORIDE 0.9 % IV SOLN
INTRAVENOUS | Status: DC
  Administered 2015-09-19: 18:00:00 via INTRAVENOUS

## 2015-09-19 MED ORDER — RAMIPRIL 10 MG PO CAPS: 10.0000 mg | ORAL_CAPSULE | Freq: Every day | ORAL | Status: DC

## 2015-09-19 MED ORDER — SIMVASTATIN 20 MG PO TABS: 20.0000 mg | ORAL_TABLET | Freq: Every day | ORAL | Status: DC

## 2015-09-19 MED ORDER — HYDROMORPHONE HCL 1 MG/ML IJ SOLN
1.0000 mg | Freq: Once | INTRAMUSCULAR | Status: DC
  Filled 2015-09-19: qty 1

## 2015-09-19 MED ORDER — METFORMIN HCL 500 MG PO TABS
500.0000 mg | ORAL_TABLET | Freq: Two times a day (BID) | ORAL | Status: DC
Start: 2015-09-19 — End: 2015-10-24

## 2015-09-19 MED ORDER — HYDROCODONE-ACETAMINOPHEN 5-325 MG PO TABS: 1.0000 | ORAL_TABLET | Freq: Four times a day (QID) | ORAL | Status: DC | PRN

## 2015-09-19 MED ORDER — AMLODIPINE BESYLATE 10 MG PO TABS: 10.0000 mg | ORAL_TABLET | Freq: Every day | ORAL | Status: DC

## 2015-09-19 MED ORDER — HYDROMORPHONE HCL 1 MG/ML IJ SOLN
1.0000 mg | Freq: Once | INTRAMUSCULAR | Status: DC
Start: 2015-09-19 — End: 2015-09-19

## 2015-09-19 MED ORDER — HYDROMORPHONE HCL 1 MG/ML IJ SOLN
1.0000 mg | Freq: Once | INTRAMUSCULAR | Status: AC
Start: 2015-09-19 — End: 2015-09-19
  Administered 2015-09-19: 1 mg via INTRAVENOUS
  Filled 2015-09-19: qty 1

## 2015-09-19 MED ORDER — ZOLPIDEM TARTRATE 10 MG PO TABS: 10.0000 mg | ORAL_TABLET | Freq: Every evening | ORAL | Status: DC | PRN

## 2015-09-19 MED ORDER — HYDROMORPHONE HCL 1 MG/ML IJ SOLN
1.0000 mg | Freq: Once | INTRAMUSCULAR | Status: AC
  Administered 2015-09-19: 1 mg via INTRAVENOUS

## 2015-09-19 MED ORDER — HYDROMORPHONE HCL 1 MG/ML IJ SOLN: 1.0000 mg | Freq: Once | INTRAMUSCULAR | Status: DC

## 2015-09-19 MED ORDER — SODIUM CHLORIDE 0.9 % IV BOLUS (SEPSIS)
1000.0000 mL | Freq: Once | INTRAVENOUS | Status: AC
  Administered 2015-09-19: 1000 mL via INTRAVENOUS

## 2015-09-19 MED ORDER — FENTANYL CITRATE (PF) 100 MCG/2ML IJ SOLN
50.0000 ug | INTRAMUSCULAR | Status: DC | PRN
  Administered 2015-09-19: 50 ug via NASAL

## 2015-09-19 MED ORDER — METFORMIN HCL 500 MG PO TABS
500.0000 mg | ORAL_TABLET | Freq: Once | ORAL | Status: AC
  Administered 2015-09-19: 500 mg via ORAL
  Filled 2015-09-19: qty 1

## 2015-09-19 NOTE — Progress Notes (Signed)
Pre visit review using our clinic review tool, if applicable. No additional management support is needed unless otherwise documented below in the visit note. 

## 2015-09-19 NOTE — Progress Notes (Signed)
Subjective:    Patient ID: Willie Smith, male    DOB: Oct 30, 1940, 75 y.o.   MRN: [id number]  HPI  75 year old patient who has hypertension and dyslipidemia.  He has done well except for some situational stress and associated insomnia.  He has been using an occasional Xanax from his wife.  Otherwise, doing quite well.  Past Medical History  Diagnosis Date  . Hyperlipidemia   . Hypertension   . Systolic murmur   . GERD (gastroesophageal reflux disease)   . Hemorrhoids     Social History   Social History  . Marital Status: Single    Spouse Name: N/A  . Number of Children: N/A  . Years of Education: N/A   Occupational History  . Not on file.   Social History Main Topics  . Smoking status: Former Smoker    Quit date: 06/18/2005  . Smokeless tobacco: Never Used  . Alcohol Use: Yes     Comment: occasionally  . Drug Use: No  . Sexual Activity: Not on file   Other Topics Concern  . Not on file   Social History Narrative    Past Surgical History  Procedure Laterality Date  . Surgery elbow bilaterally    . Tonsillectomy  1948  . Colonsocopy      declines    Family History  Problem Relation Age of Onset  . Heart attack Mother   . Hypertension Father   . Diabetes Father   . Hypertension Brother   . Hypertension Brother   . Hypertension Sister   . Hypertension Sister   . Hypertension Sister     No Known Allergies  Current Outpatient Prescriptions on File Prior to Visit  Medication Sig Dispense Refill  . aspirin 81 MG tablet Take 81 mg by mouth daily.     No current facility-administered medications on file prior to visit.    BP 130/74 mmHg  Pulse 84  Temp(Src) 98.5 F (36.9 C) (Oral)  Resp 20  Ht 6' 0.5" (1.842 m)  Wt 250 lb (113.399 kg)  BMI 33.42 kg/m2  SpO2 96%     Review of Systems  Constitutional: Negative for fever, chills, appetite change and fatigue.  HENT: Negative for congestion, dental problem, ear pain, hearing loss, sore throat,  tinnitus, trouble swallowing and voice change.   Eyes: Negative for pain, discharge and visual disturbance.  Respiratory: Negative for cough, chest tightness, wheezing and stridor.   Cardiovascular: Negative for chest pain, palpitations and leg swelling.  Gastrointestinal: Negative for nausea, vomiting, abdominal pain, diarrhea, constipation, blood in stool and abdominal distention.  Genitourinary: Negative for urgency, hematuria, flank pain, discharge, difficulty urinating and genital sores.  Musculoskeletal: Negative for myalgias, back pain, joint swelling, arthralgias, gait problem and neck stiffness.  Skin: Negative for rash.  Neurological: Negative for dizziness, syncope, speech difficulty, weakness, numbness and headaches.  Hematological: Negative for adenopathy. Does not bruise/bleed easily.  Psychiatric/Behavioral: Positive for sleep disturbance. Negative for behavioral problems and dysphoric mood. The patient is nervous/anxious.        Objective:   Physical Exam  Constitutional: He is oriented to person, place, and time. He appears well-developed.  HENT:  Head: Normocephalic.  Right Ear: External ear normal.  Left Ear: External ear normal.  Eyes: Conjunctivae and EOM are normal.  Neck: Normal range of motion.  Cardiovascular: Normal rate and normal heart sounds.   Pulmonary/Chest: Breath sounds normal.  Abdominal: Bowel sounds are normal.  Musculoskeletal: Normal range of motion. He exhibits  no edema or tenderness.  Neurological: He is alert and oriented to person, place, and time.  Psychiatric: He has a normal mood and affect. His behavior is normal.          Assessment & Plan:   Episodic insomnia.  Will try Ambien when necessary.  Sleep hygiene issues discussed Hypertension, stable Dyslipidemia  Schedule CPX

## 2015-09-19 NOTE — Patient Instructions (Signed)
Limit your sodium (Salt) intake    It is important that you exercise regularly, at least 20 minutes 3 to 4 times per week.  If you develop chest pain or shortness of breath seek  medical attention.  You need to lose weight.  Consider a lower calorie diet and regular exercise.  Return in 6 months for follow-up  Insomnia Insomnia is a sleep disorder that makes it difficult to fall asleep or to stay asleep. Insomnia can cause tiredness (fatigue), low energy, difficulty concentrating, mood swings, and poor performance at work or school.  There are three different ways to classify insomnia:  Difficulty falling asleep.  Difficulty staying asleep.  Waking up too early in the morning. Any type of insomnia can be long-term (chronic) or short-term (acute). Both are common. Short-term insomnia usually lasts for three months or less. Chronic insomnia occurs at least three times a week for longer than three months. CAUSES  Insomnia may be caused by another condition, situation, or substance, such as:  Anxiety.  Certain medicines.  Gastroesophageal reflux disease (GERD) or other gastrointestinal conditions.  Asthma or other breathing conditions.  Restless legs syndrome, sleep apnea, or other sleep disorders.  Chronic pain.  Menopause. This may include hot flashes.  Stroke.  Abuse of alcohol, tobacco, or illegal drugs.  Depression.  Caffeine.   Neurological disorders, such as Alzheimer disease.  An overactive thyroid (hyperthyroidism). The cause of insomnia may not be known. RISK FACTORS Risk factors for insomnia include:  Gender. Women are more commonly affected than men.  Age. Insomnia is more common as you get older.  Stress. This may involve your professional or personal life.  Income. Insomnia is more common in people with lower income.  Lack of exercise.   Irregular work schedule or night shifts.  Traveling between different time zones. SIGNS AND SYMPTOMS If  you have insomnia, trouble falling asleep or trouble staying asleep is the main symptom. This may lead to other symptoms, such as:  Feeling fatigued.  Feeling nervous about going to sleep.  Not feeling rested in the morning.  Having trouble concentrating.  Feeling irritable, anxious, or depressed. TREATMENT  Treatment for insomnia depends on the cause. If your insomnia is caused by an underlying condition, treatment will focus on addressing the condition. Treatment may also include:   Medicines to help you sleep.  Counseling or therapy.  Lifestyle adjustments. HOME CARE INSTRUCTIONS   Take medicines only as directed by your health care provider.  Keep regular sleeping and waking hours. Avoid naps.  Keep a sleep diary to help you and your health care provider figure out what could be causing your insomnia. Include:   When you sleep.  When you wake up during the night.  How well you sleep.   How rested you feel the next day.  Any side effects of medicines you are taking.  What you eat and drink.   Make your bedroom a comfortable place where it is easy to fall asleep:  Put up shades or special blackout curtains to block light from outside.  Use a white noise machine to block noise.  Keep the temperature cool.   Exercise regularly as directed by your health care provider. Avoid exercising right before bedtime.  Use relaxation techniques to manage stress. Ask your health care provider to suggest some techniques that may work well for you. These may include:  Breathing exercises.  Routines to release muscle tension.  Visualizing peaceful scenes.  Cut back on alcohol, caffeinated  beverages, and cigarettes, especially close to bedtime. These can disrupt your sleep.  Do not overeat or eat spicy foods right before bedtime. This can lead to digestive discomfort that can make it hard for you to sleep.  Limit screen use before bedtime. This includes:  Watching  TV.  Using your smartphone, tablet, and computer.  Stick to a routine. This can help you fall asleep faster. Try to do a quiet activity, brush your teeth, and go to bed at the same time each night.  Get out of bed if you are still awake after 15 minutes of trying to sleep. Keep the lights down, but try reading or doing a quiet activity. When you feel sleepy, go back to bed.  Make sure that you drive carefully. Avoid driving if you feel very sleepy.  Keep all follow-up appointments as directed by your health care provider. This is important. SEEK MEDICAL CARE IF:   You are tired throughout the day or have trouble in your daily routine due to sleepiness.  You continue to have sleep problems or your sleep problems get worse. SEEK IMMEDIATE MEDICAL CARE IF:   You have serious thoughts about hurting yourself or someone else.   This information is not intended to replace advice given to you by your health care provider. Make sure you discuss any questions you have with your health care provider.   Document Released: 06/01/2000 Document Revised: 02/23/2015 Document Reviewed: 03/05/2014 [employer] Interactive Patient Education [employer]

## 2015-09-19 NOTE — Discharge Instructions (Signed)
It was our pleasure to provide your ER care today - we hope that you feel better.  Increase activity as pain allows.  Advise sleeping in a recliner for the next 2-3 weeks until your pain lessens enough that you can get in/out of bed without too much difficulty.  Use incentive spirometer (breathing toy) 10x/hour while awake.  You mao take hydrocodone as need for pain. No driving  when taking hydrocodone. Also, do not take tylenol or acetaminophen containing medication when taking hydrocodone.  From today's labs, your blood sugar is very high (glucose 390).  Take metformin as prescribed. Follow diabetic diet.  Monitor blood sugars. Follow up with your primary care doctor in the next few days for recheck - call office tomorrow morning, discuss blood sugar, and arrange follow up.  Your kidney function test is mildly elevated (creatinine 1.59) - discuss with your doctor in the next couple days, including as relates your blood pressure medication.   Discuss possible medication adjustment then.  Your blood pressure is high today, have it rechecked at follow up with your doctor this week.  Return to ER right away if worse, difficulty breathing, weak/faint, intractable pain, other concern.  You were given pain medication in the ER - no driving for the next 4 hours.      Hyperglycemia Hyperglycemia occurs when the glucose (sugar) in your blood is too high. Hyperglycemia can happen for many reasons, but it most often happens to people who do not know they have diabetes or are not managing their diabetes properly.  CAUSES  Whether you have diabetes or not, there are other causes of hyperglycemia. Hyperglycemia can occur when you have diabetes, but it can also occur in other situations that you might not be as aware of, such as: Diabetes  If you have diabetes and are having problems controlling your blood glucose, hyperglycemia could occur because of some of the following reasons:  Not following  your meal plan.  Not taking your diabetes medications or not taking it properly.  Exercising less or doing less activity than you normally do.  Being sick. Pre-diabetes  This cannot be ignored. Before people develop Type 2 diabetes, they almost always have "pre-diabetes." This is when your blood glucose levels are higher than normal, but not yet high enough to be diagnosed as diabetes. Research has shown that some long-term damage to the body, especially the heart and circulatory system, may already be occurring during pre-diabetes. If you take action to manage your blood glucose when you have pre-diabetes, you may delay or prevent Type 2 diabetes from developing. Stress  If you have diabetes, you may be "diet" controlled or on oral medications or insulin to control your diabetes. However, you may find that your blood glucose is higher than usual in the hospital whether you have diabetes or not. This is often referred to as "stress hyperglycemia." Stress can elevate your blood glucose. This happens because of hormones put out by the body during times of stress. If stress has been the cause of your high blood glucose, it can be followed regularly by your caregiver. That way he/she can make sure your hyperglycemia does not continue to get worse or progress to diabetes. Steroids  Steroids are medications that act on the infection fighting system (immune system) to block inflammation or infection. One side effect can be a rise in blood glucose. Most people can produce enough extra insulin to allow for this rise, but for those who cannot, steroids make blood  glucose levels go even higher. It is not unusual for steroid treatments to "uncover" diabetes that is developing. It is not always possible to determine if the hyperglycemia will go away after the steroids are stopped. A special blood test called an A1c is sometimes done to determine if your blood glucose was elevated before the steroids were  started. SYMPTOMS  Thirsty.  Frequent urination.  Dry mouth.  Blurred vision.  Tired or fatigue.  Weakness.  Sleepy.  Tingling in feet or leg. DIAGNOSIS  Diagnosis is made by monitoring blood glucose in one or all of the following ways:  A1c test. This is a chemical found in your blood.  Fingerstick blood glucose monitoring.  Laboratory results. TREATMENT  First, knowing the cause of the hyperglycemia is important before the hyperglycemia can be treated. Treatment may include, but is not be limited to:  Education.  Change or adjustment in medications.  Change or adjustment in meal plan.  Treatment for an illness, infection, etc.  More frequent blood glucose monitoring.  Change in exercise plan.  Decreasing or stopping steroids.  Lifestyle changes. HOME CARE INSTRUCTIONS   Test your blood glucose as directed.  Exercise regularly. Your caregiver will give you instructions about exercise. Pre-diabetes or diabetes which comes on with stress is helped by exercising.  Eat wholesome, balanced meals. Eat often and at regular, fixed times. Your caregiver or nutritionist will give you a meal plan to guide your sugar intake.  Being at an ideal weight is important. If needed, losing as little as 10 to 15 pounds may help improve blood glucose levels. SEEK MEDICAL CARE IF:   You have questions about medicine, activity, or diet.  You continue to have symptoms (problems such as increased thirst, urination, or weight gain). SEEK IMMEDIATE MEDICAL CARE IF:   You are vomiting or have diarrhea.  Your breath smells fruity.  You are breathing faster or slower.  You are very sleepy or incoherent.  You have numbness, tingling, or pain in your feet or hands.  You have chest pain.  Your symptoms get worse even though you have been following your caregiver's orders.  If you have any other questions or concerns.   This information is not intended to replace advice  given to you by your health care provider. Make sure you discuss any questions you have with your health care provider.   Document Released: 11/28/2000 Document Revised: 08/27/2011 Document Reviewed: 02/08/2015 Elsevier Interactive Patient Education 2016 [employer]    Type 2 Diabetes Mellitus, Adult Type 2 diabetes mellitus, often simply referred to as type 2 diabetes, is a long-lasting (chronic) disease. In type 2 diabetes, the pancreas does not make enough insulin (a hormone), the cells are less responsive to the insulin that is made (insulin resistance), or both. Normally, insulin moves sugars from food into the tissue cells. The tissue cells use the sugars for energy. The lack of insulin or the lack of normal response to insulin causes excess sugars to build up in the blood instead of going into the tissue cells. As a result, high blood sugar (hyperglycemia) develops. The effect of high sugar (glucose) levels can cause many complications. Type 2 diabetes was also previously called adult-onset diabetes, but it can occur at any age.  RISK FACTORS  A person is predisposed to developing type 2 diabetes if someone in the family has the disease and also has one or more of the following primary risk factors:  Weight gain, or being overweight or obese.  An inactive lifestyle.  A history of consistently eating high-calorie foods. Maintaining a normal weight and regular physical activity can reduce the chance of developing type 2 diabetes. SYMPTOMS  A person with type 2 diabetes may not show symptoms initially. The symptoms of type 2 diabetes appear slowly. The symptoms include:  Increased thirst (polydipsia).  Increased urination (polyuria).  Increased urination during the night (nocturia).  Sudden or unexplained weight changes.  Frequent, recurring infections.  Tiredness (fatigue).  Weakness.  Vision changes, such as blurred vision.  Fruity smell to your breath.  Abdominal  pain.  Nausea or vomiting.  Cuts or bruises which are slow to heal.  Tingling or numbness in the hands or feet.  An open skin wound (ulcer). DIAGNOSIS Type 2 diabetes is frequently not diagnosed until complications of diabetes are present. Type 2 diabetes is diagnosed when symptoms or complications are present and when blood glucose levels are increased. Your blood glucose level may be checked by one or more of the following blood tests:  A fasting blood glucose test. You will not be allowed to eat for at least 8 hours before a blood sample is taken.  A random blood glucose test. Your blood glucose is checked at any time of the day regardless of when you ate.  A hemoglobin A1c blood glucose test. A hemoglobin A1c test provides information about blood glucose control over the previous 3 months.  An oral glucose tolerance test (OGTT). Your blood glucose is measured after you have not eaten (fasted) for 2 hours and then after you drink a glucose-containing beverage. TREATMENT   You may need to take insulin or diabetes medicine daily to keep blood glucose levels in the desired range.  If you use insulin, you may need to adjust the dosage depending on the carbohydrates that you eat with each meal or snack.  Lifestyle changes are recommended as part of your treatment. These may include:  Following an individualized diet plan developed by a nutritionist or dietitian.  Exercising daily. Your health care providers will set individualized treatment goals for you based on your age, your medicines, how long you have had diabetes, and any other medical conditions you have. Generally, the goal of treatment is to maintain the following blood glucose levels:  Before meals (preprandial): 80-130 mg/dL.  After meals (postprandial): below 180 mg/dL.  A1c: less than 6.5-7%. HOME CARE INSTRUCTIONS   Have your hemoglobin A1c level checked twice a year.  Perform daily blood glucose monitoring as  directed by your health care provider.  Monitor urine ketones when you are ill and as directed by your health care provider.  Take your diabetes medicine or insulin as directed by your health care provider to maintain your blood glucose levels in the desired range.  Never run out of diabetes medicine or insulin. It is needed every day.  If you are using insulin, you may need to adjust the amount of insulin given based on your intake of carbohydrates. Carbohydrates can raise blood glucose levels but need to be included in your diet. Carbohydrates provide vitamins, minerals, and fiber which are an essential part of a healthy diet. Carbohydrates are found in fruits, vegetables, whole grains, dairy products, legumes, and foods containing added sugars.  Eat healthy foods. You should make an appointment to see a registered dietitian to help you create an eating plan that is right for you.  Lose weight if you are overweight.  Carry a medical alert card or wear your medical alert  jewelry.  Carry a 15-gram carbohydrate snack with you at all times to treat low blood glucose (hypoglycemia). Some examples of 15-gram carbohydrate snacks include:  Glucose tablets, 3 or 4.  Glucose gel, 15-gram tube.  Raisins, 2 tablespoons (24 grams).  Jelly beans, 6.  Animal crackers, 8.  Regular pop, 4 ounces (120 mL).  Gummy treats, 9.  Recognize hypoglycemia. Hypoglycemia occurs with blood glucose levels of 70 mg/dL and below. The risk for hypoglycemia increases when fasting or skipping meals, during or after intense exercise, and during sleep. Hypoglycemia symptoms can include:  Tremors or shakes.  Decreased ability to concentrate.  Sweating.  Increased heart rate.  Headache.  Dry mouth.  Hunger.  Irritability.  Anxiety.  Restless sleep.  Altered speech or coordination.  Confusion.  Treat hypoglycemia promptly. If you are alert and able to safely swallow, follow the 15:15  rule:  Take 15-20 grams of rapid-acting glucose or carbohydrate. Rapid-acting options include glucose gel, glucose tablets, or 4 ounces (120 mL) of fruit juice, regular soda, or low-fat milk.  Check your blood glucose level 15 minutes after taking the glucose.  Take 15-20 grams more of glucose if the repeat blood glucose level is still 70 mg/dL or below.  Eat a meal or snack within 1 hour once blood glucose levels return to normal.  Be alert to feeling very thirsty and urinating more frequently than usual, which are early signs of hyperglycemia. An early awareness of hyperglycemia allows for prompt treatment. Treat hyperglycemia as directed by your health care provider.  Engage in at least 150 minutes of moderate-intensity physical activity a week, spread over at least 3 days of the week or as directed by your health care provider. In addition, you should engage in resistance exercise at least 2 times a week or as directed by your health care provider. Try to spend no more than 90 minutes at one time inactive.  Adjust your medicine and food intake as needed if you start a new exercise or sport.  Follow your sick-day plan anytime you are unable to eat or drink as usual.  Do not use any tobacco products including cigarettes, chewing tobacco, or electronic cigarettes. If you need help quitting, ask your health care provider.  Limit alcohol intake to no more than 1 drink per day for nonpregnant women and 2 drinks per day for men. You should drink alcohol only when you are also eating food. Talk with your health care provider whether alcohol is safe for you. Tell your health care provider if you drink alcohol several times a week.  Keep all follow-up visits as directed by your health care provider. This is important.  Schedule an eye exam soon after the diagnosis of type 2 diabetes and then annually.  Perform daily skin and foot care. Examine your skin and feet daily for cuts, bruises, redness,  nail problems, bleeding, blisters, or sores. A foot exam by a health care provider should be done annually.  Brush your teeth and gums at least twice a day and floss at least once a day. Follow up with your dentist regularly.  Share your diabetes management plan with your workplace or school.  Keep your immunizations up to date. It is recommended that you receive a flu (influenza) vaccine every year. It is also recommended that you receive a pneumonia (pneumococcal) vaccine. If you are 72 years of age or older and have never received a pneumonia vaccine, this vaccine may be given as a series of two  separate shots. Ask your health care provider which additional vaccines may be recommended.  Learn to manage stress.  Obtain ongoing diabetes education and support as needed.  Participate in or seek rehabilitation as needed to maintain or improve independence and quality of life. Request a physical or occupational therapy referral if you are having foot or hand numbness, or difficulties with grooming, dressing, eating, or physical activity. SEEK MEDICAL CARE IF:   You are unable to eat food or drink fluids for more than 6 hours.  You have nausea and vomiting for more than 6 hours.  Your blood glucose level is over 240 mg/dL.  There is a change in mental status.  You develop an additional serious illness.  You have diarrhea for more than 6 hours.  You have been sick or have had a fever for a couple of days and are not getting better.  You have pain during any physical activity.  SEEK IMMEDIATE MEDICAL CARE IF:  You have difficulty breathing.  You have moderate to large ketone levels.   This information is not intended to replace advice given to you by your health care provider. Make sure you discuss any questions you have with your health care provider.   Document Released: 06/04/2005 Document Revised: 02/23/2015 Document Reviewed: 01/01/2012 Elsevier Interactive Patient Education  2016 [employer].    Diabetes Mellitus and Food It is important for you to manage your blood sugar (glucose) level. Your blood glucose level can be greatly affected by what you eat. Eating healthier foods in the appropriate amounts throughout the day at about the same time each day will help you control your blood glucose level. It can also help slow or prevent worsening of your diabetes mellitus. Healthy eating may even help you improve the level of your blood pressure and reach or maintain a healthy weight.  General recommendations for healthful eating and cooking habits include:  Eating meals and snacks regularly. Avoid going long periods of time without eating to lose weight.  Eating a diet that consists mainly of plant-based foods, such as fruits, vegetables, nuts, legumes, and whole grains.  Using low-heat cooking methods, such as baking, instead of high-heat cooking methods, such as deep frying. Work with your dietitian to make sure you understand how to use the Nutrition Facts information on food labels. HOW CAN FOOD AFFECT ME? Carbohydrates Carbohydrates affect your blood glucose level more than any other type of food. Your dietitian will help you determine how many carbohydrates to eat at each meal and teach you how to count carbohydrates. Counting carbohydrates is important to keep your blood glucose at a healthy level, especially if you are using insulin or taking certain medicines for diabetes mellitus. Alcohol Alcohol can cause sudden decreases in blood glucose (hypoglycemia), especially if you use insulin or take certain medicines for diabetes mellitus. Hypoglycemia can be a life-threatening condition. Symptoms of hypoglycemia (sleepiness, dizziness, and disorientation) are similar to symptoms of having too much alcohol.  If your health care provider has given you approval to drink alcohol, do so in moderation and use the following guidelines:  Women should not have more than  one drink per day, and men should not have more than two drinks per day. One drink is equal to:  12 oz of beer.  5 oz of wine.  1 oz of hard liquor.  Do not drink on an empty stomach.  Keep yourself hydrated. Have water, diet soda, or unsweetened iced tea.  Regular soda, juice, and  other mixers might contain a lot of carbohydrates and should be counted. WHAT FOODS ARE NOT RECOMMENDED? As you make food choices, it is important to remember that all foods are not the same. Some foods have fewer nutrients per serving than other foods, even though they might have the same number of calories or carbohydrates. It is difficult to get your body what it needs when you eat foods with fewer nutrients. Examples of foods that you should avoid that are high in calories and carbohydrates but low in nutrients include:  Trans fats (most processed foods list trans fats on the Nutrition Facts label).  Regular soda.  Juice.  Candy.  Sweets, such as cake, pie, doughnuts, and cookies.  Fried foods. WHAT FOODS CAN I EAT? Eat nutrient-rich foods, which will nourish your body and keep you healthy. The food you should eat also will depend on several factors, including:  The calories you need.  The medicines you take.  Your weight.  Your blood glucose level.  Your blood pressure level.  Your cholesterol level. You should eat a variety of foods, including:  Protein.  Lean cuts of meat.  Proteins low in saturated fats, such as fish, egg whites, and beans. Avoid processed meats.  Fruits and vegetables.  Fruits and vegetables that may help control blood glucose levels, such as apples, mangoes, and yams.  Dairy products.  Choose fat-free or low-fat dairy products, such as milk, yogurt, and cheese.  Grains, bread, pasta, and rice.  Choose whole grain products, such as multigrain bread, whole oats, and brown rice. These foods may help control blood pressure.  Fats.  Foods containing  healthful fats, such as nuts, avocado, olive oil, canola oil, and fish. DOES EVERYONE WITH DIABETES MELLITUS HAVE THE SAME MEAL PLAN? Because every person with diabetes mellitus is different, there is not one meal plan that works for everyone. It is very important that you meet with a dietitian who will help you create a meal plan that is just right for you.   This information is not intended to replace advice given to you by your health care provider. Make sure you discuss any questions you have with your health care provider.   Document Released: 03/01/2005 Document Revised: 06/25/2014 Document Reviewed: 05/01/2013 Elsevier Interactive Patient Education 2016 [employer] Inc.    Rib Fracture A rib fracture is a break or crack in one of the bones of the ribs. The ribs are a group of long, curved bones that wrap around your chest and attach to your spine. They protect your lungs and other organs in the chest cavity. A broken or cracked rib is often painful, but most do not cause other problems. Most rib fractures heal on their own over time. However, rib fractures can be more serious if multiple ribs are broken or if broken ribs move out of place and push against other structures. CAUSES   A direct blow to the chest. For example, this could happen during contact sports, a car accident, or a fall against a hard object.  Repetitive movements with high force, such as pitching a baseball or having severe coughing spells. SYMPTOMS   Pain when you breathe in or cough.  Pain when someone presses on the injured area. DIAGNOSIS  Your caregiver will perform a physical exam. Various imaging tests may be ordered to confirm the diagnosis and to look for related injuries. These tests may include a chest X-ray, computed tomography (CT), magnetic resonance imaging (MRI), or a bone scan.  TREATMENT  Rib fractures usually heal on their own in 1-3 months. The longer healing period is often associated with a  continued cough or other aggravating activities. During the healing period, pain control is very important. Medication is usually given to control pain. Hospitalization or surgery may be needed for more severe injuries, such as those in which multiple ribs are broken or the ribs have moved out of place.  HOME CARE INSTRUCTIONS   Avoid strenuous activity and any activities or movements that cause pain. Be careful during activities and avoid bumping the injured rib.  Gradually increase activity as directed by your caregiver.  Only take over-the-counter or prescription medications as directed by your caregiver. Do not take other medications without asking your caregiver first.  Apply ice to the injured area for the first 1-2 days after you have been treated or as directed by your caregiver. Applying ice helps to reduce inflammation and pain.  Put ice in a plastic bag.  Place a towel between your skin and the bag.   Leave the ice on for 15-20 minutes at a time, every 2 hours while you are awake.  Perform deep breathing as directed by your caregiver. This will help prevent pneumonia, which is a common complication of a broken rib. Your caregiver may instruct you to:  Take deep breaths several times a day.  Try to cough several times a day, holding a pillow against the injured area.  Use a device called an incentive spirometer to practice deep breathing several times a day.  Drink enough fluids to keep your urine clear or pale yellow. This will help you avoid constipation.   Do not wear a rib belt or binder. These restrict breathing, which can lead to pneumonia.  SEEK IMMEDIATE MEDICAL CARE IF:   You have a fever.   You have difficulty breathing or shortness of breath.   You develop a continual cough, or you cough up thick or bloody sputum.  You feel sick to your stomach (nausea), throw up (vomit), or have abdominal pain.   You have worsening pain not controlled with  medications.  MAKE SURE YOU:  Understand these instructions.  Will watch your condition.  Will get help right away if you are not doing well or get worse.   This information is not intended to replace advice given to you by your health care provider. Make sure you discuss any questions you have with your health care provider.   Document Released: 06/04/2005 Document Revised: 02/04/2013 Document Reviewed: 08/06/2012 Elsevier Interactive Patient Education 2016 [employer].    Pneumothorax A pneumothorax, commonly called a collapsed lung, is a condition in which air leaks from a lung and builds up in the space between the lung and the chest wall (pleural space). The air in a pneumothorax is trapped outside the lung and takes up space, preventing the lung from fully expanding. This is a condition that usually occurs suddenly. The buildup of air may be small or large. A small pneumothorax may go away on its own. When a pneumothorax is larger, it will often require medical treatment and hospitalization.  CAUSES  A pneumothorax can sometimes happen quickly with no apparent cause. People with underlying lung problems, particularly COPD or emphysema, are at higher risk of pneumothorax. However, pneumothorax can happen quickly even in people with no prior known lung problems. Trauma, surgery, medical procedures, or injury to the chest wall can also cause a pneumothorax. SIGNS AND SYMPTOMS  Sometimes a pneumothorax will  have no symptoms. When symptoms are present, they can include:  Chest pain.  Shortness of breath.  Increased rate of breathing.  Bluish color to your lips or skin (cyanosis). DIAGNOSIS  Pneumothorax is usually diagnosed by a chest X-ray or chest CT scan. Your health care provider will also take a medical history and perform a physical exam to determine why you may have a pneumothorax. TREATMENT  A small pneumothorax may go away on its own without treatment. Extra oxygen can  sometimes help a small pneumothorax go away more quickly. For a larger pneumothorax or a pneumothorax that is causing symptoms, a procedure is usually needed to drain the air.In some cases, the health care provider may drain the air using a needle. In other cases, a chest tube may be inserted into the pleural space. A chest tube is a small tube placed between the ribs and into the pleural space. This removes the extra air and allows the lung to expand back to its normal size. A large pneumothorax will usually require a hospital stay. If there is ongoing air leakage into the pleural space, then the chest tube may need to remain in place for several days until the air leak has healed. In some cases, surgery may be needed.  HOME CARE INSTRUCTIONS   Only take over-the-counter or prescription medicines as directed by your health care provider.  If a cough or pain makes it difficult for you to sleep at night, try sleeping in a semi-upright position in a recliner or by using 2 or 3 pillows.  Rest and limit activity as directed by your health care provider.  If you had a chest tube and it was removed, ask your health care provider when it is okay to remove the dressing. Until your health care provider says you can remove the dressing, do not allow it to get wet.  Do not smoke. Smoking is a risk factor for pneumothorax.  Do not fly in an airplane or scuba dive until your health care provider says it is okay.  Follow up with your health care provider as directed. SEEK IMMEDIATE MEDICAL CARE IF:   You have increasing chest pain or shortness of breath.  You have a cough that is not controlled with suppressants.  You begin coughing up blood.  You have pain that is getting worse or is not controlled with medicines.  You cough up thick, discolored mucus (sputum) that is yellow to green in color.  You have redness, increasing pain, or discharge at the site where a chest tube had been in place (if your  pneumothorax was treated with a chest tube).  The site where your chest tube was located opens up.  You feel air coming out of the site where the chest tube was placed.  You have a fever or persistent symptoms for more than 2-3 days.  You have a fever and your symptoms suddenly get worse. MAKE SURE YOU:   Understand these instructions.  Will watch your condition.  Will get help right away if you are not doing well or get worse.   This information is not intended to replace advice given to you by your health care provider. Make sure you discuss any questions you have with your health care provider.   Document Released: 06/04/2005 Document Revised: 03/25/2013 Document Reviewed: 01/01/2013 Elsevier Interactive Patient Education 2016 [employer].    Incentive Spirometer An incentive spirometer is a tool that can help keep your lungs clear and active. This  tool measures how well you are filling your lungs with each breath. Taking long, deep breaths may help reverse or decrease the chance of developing breathing (pulmonary) problems (especially infection) following:  Surgery of the chest or abdomen.  Surgery if you have a history of smoking or a lung problem.  A long period of time when you are unable to move or be active. BEFORE THE PROCEDURE   If the spirometer includes an indicator to show your best effort, your nurse or respiratory therapist will set it to a desired goal.  If possible, sit up straight or lean slightly forward. Try not to slouch.  Hold the incentive spirometer in an upright position. INSTRUCTIONS FOR USE   Sit on the edge of your bed if possible, or sit up as far as you can in bed or on a chair.  Hold the incentive spirometer in an upright position.  Breathe out normally.  Place the mouthpiece in your mouth and seal your lips tightly around it.  Breathe in slowly and as deeply as possible, raising the piston or the ball toward the top of the  column.  Hold your breath for 3-5 seconds or for as long as possible. Allow the piston or ball to fall to the bottom of the column.  Remove the mouthpiece from your mouth and breathe out normally.  Rest for a few seconds and repeat Steps 1 through 7 at least 10 times every 1-2 hours when you are awake. Take your time and take a few normal breaths between deep breaths.  The spirometer may include an indicator to show your best effort. Use the indicator as a goal to work toward during each repetition.  After each set of 10 deep breaths, practice coughing to be sure your lungs are clear. If you have an incision (the cut made at the time of surgery), support your incision when coughing by placing a pillow or rolled-up towels firmly against it. Once you are able to get out of bed, walk around indoors and cough well. You may stop using the incentive spirometer when instructed by your caregiver.  RISKS AND COMPLICATIONS  Breathing too quickly may cause dizziness. At an extreme, this could cause you to pass out. Take your time so you do not get dizzy or light-headed.  If you are in pain, you may need to take or ask for pain medication before doing incentive spirometry. It is harder to take a deep breath if you are having pain. AFTER USE  Rest and breathe slowly and easily.  It can be helpful to keep a log of your progress. Your caregiver can provide you with a simple table to help with this. If you are using the spirometer at home, follow these instructions: SEEK MEDICAL CARE IF:   You are having difficultly using the spirometer.  You have trouble using the spirometer as often as instructed.  Your pain medication is not giving enough relief while using the spirometer.  You develop fever of 100.31F (38.1C) or higher. SEEK IMMEDIATE MEDICAL CARE IF:   You cough up bloody sputum that had not been present before.  You develop fever of 102F (38.9C) or greater.  You develop worsening pain  at or near the incision site. MAKE SURE YOU:   Understand these instructions.  Will watch your condition.  Will get help right away if you are not doing well or get worse.   This information is not intended to replace advice given to you by your  health care provider. Make sure you discuss any questions you have with your health care provider.   Document Released: 10/15/2006 Document Revised: 06/25/2014 Document Reviewed: 01/11/2014 Elsevier Interactive Patient Education 2016 [employer].    Hypertension Hypertension, commonly called high blood pressure, is when the force of blood pumping through your arteries is too strong. Your arteries are the blood vessels that carry blood from your heart throughout your body. A blood pressure reading consists of a higher number over a lower number, such as 110/72. The higher number (systolic) is the pressure inside your arteries when your heart pumps. The lower number (diastolic) is the pressure inside your arteries when your heart relaxes. Ideally you want your blood pressure below 120/80. Hypertension forces your heart to work harder to pump blood. Your arteries may become narrow or stiff. Having untreated or uncontrolled hypertension can cause heart attack, stroke, kidney disease, and other problems. RISK FACTORS Some risk factors for high blood pressure are controllable. Others are not.  Risk factors you cannot control include:   Race. You may be at higher risk if you are African American.  Age. Risk increases with age.  Gender. Men are at higher risk than women before age 17 years. After age 60, women are at higher risk than men. Risk factors you can control include:  Not getting enough exercise or physical activity.  Being overweight.  Getting too much fat, sugar, calories, or salt in your diet.  Drinking too much alcohol. SIGNS AND SYMPTOMS Hypertension does not usually cause signs or symptoms. Extremely high blood pressure  (hypertensive crisis) may cause headache, anxiety, shortness of breath, and nosebleed. DIAGNOSIS To check if you have hypertension, your health care provider will measure your blood pressure while you are seated, with your arm held at the level of your heart. It should be measured at least twice using the same arm. Certain conditions can cause a difference in blood pressure between your right and left arms. A blood pressure reading that is higher than normal on one occasion does not mean that you need treatment. If it is not clear whether you have high blood pressure, you may be asked to return on a different day to have your blood pressure checked again. Or, you may be asked to monitor your blood pressure at home for 1 or more weeks. TREATMENT Treating high blood pressure includes making lifestyle changes and possibly taking medicine. Living a healthy lifestyle can help lower high blood pressure. You may need to change some of your habits. Lifestyle changes may include:  Following the DASH diet. This diet is high in fruits, vegetables, and whole grains. It is low in salt, red meat, and added sugars.  Keep your sodium intake below 2,300 mg per day.  Getting at least 30-45 minutes of aerobic exercise at least 4 times per week.  Losing weight if necessary.  Not smoking.  Limiting alcoholic beverages.  Learning ways to reduce stress. Your health care provider may prescribe medicine if lifestyle changes are not enough to get your blood pressure under control, and if one of the following is true:  You are 75-44 years of age and your systolic blood pressure is above 140.  You are 47 years of age or older, and your systolic blood pressure is above 150.  Your diastolic blood pressure is above 90.  You have diabetes, and your systolic blood pressure is over 140 or your diastolic blood pressure is over 90.  You have kidney disease and your  blood pressure is above 140/90.  You have heart disease  and your blood pressure is above 140/90. Your personal target blood pressure may vary depending on your medical conditions, your age, and other factors. HOME CARE INSTRUCTIONS  Have your blood pressure rechecked as directed by your health care provider.   Take medicines only as directed by your health care provider. Follow the directions carefully. Blood pressure medicines must be taken as prescribed. The medicine does not work as well when you skip doses. Skipping doses also puts you at risk for problems.  Do not smoke.   Monitor your blood pressure at home as directed by your health care provider. SEEK MEDICAL CARE IF:   You think you are having a reaction to medicines taken.  You have recurrent headaches or feel dizzy.  You have swelling in your ankles.  You have trouble with your vision. SEEK IMMEDIATE MEDICAL CARE IF:  You develop a severe headache or confusion.  You have unusual weakness, numbness, or feel faint.  You have severe chest or abdominal pain.  You vomit repeatedly.  You have trouble breathing. MAKE SURE YOU:   Understand these instructions.  Will watch your condition.  Will get help right away if you are not doing well or get worse.   This information is not intended to replace advice given to you by your health care provider. Make sure you discuss any questions you have with your health care provider.   Document Released: 06/04/2005 Document Revised: 10/19/2014 Document Reviewed: 03/27/2013 Elsevier Interactive Patient Education [employer]2016 Elsevier Inc.

## 2015-09-19 NOTE — ED Notes (Signed)
   CBG 324   

## 2015-09-19 NOTE — ED Notes (Signed)
Pt reports falling back onto a stone wall, having pain to posterior right ribs, difficulty taking a deep breath.

## 2015-09-19 NOTE — ED Provider Notes (Signed)
CSN: [id number]     Arrival date & time 09/19/15  1416 History   First MD Initiated Contact with Patient 09/19/15 1528     Chief Complaint  Patient presents with  . Fall     (Consider location/radiation/quality/duration/timing/severity/associated sxs/prior Treatment) Patient is a 75 y.o. male presenting with fall. The history is provided by the patient.  Fall Associated symptoms include chest pain. Pertinent negatives include no abdominal pain and no headaches.  Patient s/p fall today, shortly pta.  Was up on some type of rock wall, and lost footing, right chest hitting top/edge of wall. C/o constant right lateral mid to lower chest pain since. Severe, non radiating, worse w change in position and palpation. Denies other pain or injury. Mild sob. Felt at baseline, asymptomatic prior to fall. No faintness or dizziness. Denies anterior chest pain. No fever or chills. No headache. No neck or back pain.      Past Medical History  Diagnosis Date  . Hyperlipidemia   . Hypertension   . Systolic murmur   . GERD (gastroesophageal reflux disease)   . Hemorrhoids    Past Surgical History  Procedure Laterality Date  . Surgery elbow bilaterally    . Tonsillectomy  1948  . Colonsocopy      declines   Family History  Problem Relation Age of Onset  . Heart attack Mother   . Hypertension Father   . Diabetes Father   . Hypertension Brother   . Hypertension Brother   . Hypertension Sister   . Hypertension Sister   . Hypertension Sister    Social History  Substance Use Topics  . Smoking status: Former Smoker    Quit date: 06/18/2005  . Smokeless tobacco: Never Used  . Alcohol Use: Yes     Comment: occasionally    Review of Systems  Constitutional: Negative for fever and chills.  HENT: Negative for sore throat.   Eyes: Negative for redness.  Respiratory: Negative for cough.   Cardiovascular: Positive for chest pain.  Gastrointestinal: Negative for vomiting, abdominal pain and  diarrhea.  Genitourinary: Negative for flank pain.  Musculoskeletal: Negative for back pain and neck pain.  Skin: Negative for rash.  Neurological: Negative for weakness, numbness and headaches.  Hematological: Does not bruise/bleed easily.  Psychiatric/Behavioral: Negative for confusion.      Allergies  Review of patient's allergies indicates no known allergies.  Home Medications   Prior to Admission medications   Medication Sig Start Date End Date Taking? Authorizing Provider  amLODipine (NORVASC) 10 MG tablet Take 1 tablet (10 mg total) by mouth daily. 09/19/15   Gordy Savers, MD  aspirin 81 MG tablet Take 81 mg by mouth daily.    Historical Provider, MD  hydrochlorothiazide (HYDRODIURIL) 25 MG tablet Take 1 tablet (25 mg total) by mouth daily. 09/19/15   Gordy Savers, MD  ramipril (ALTACE) 10 MG capsule Take 1 capsule (10 mg total) by mouth daily. 09/19/15   Gordy Savers, MD  simvastatin (ZOCOR) 20 MG tablet Take 1 tablet (20 mg total) by mouth at bedtime. 09/19/15   Gordy Savers, MD  zolpidem (AMBIEN) 10 MG tablet Take 1 tablet (10 mg total) by mouth at bedtime as needed for sleep. 09/19/15 10/19/15  Gordy Savers, MD   BP 169/110 mmHg  Pulse 116  Temp(Src) 97.7 F (36.5 C) (Oral)  Resp 18  SpO2 96% Physical Exam  Constitutional: He is oriented to person, place, and time. He appears well-developed and well-nourished.  No distress.  HENT:  Head: Atraumatic.  Mouth/Throat: Oropharynx is clear and moist.  Eyes: Conjunctivae are normal. Pupils are equal, round, and reactive to light. No scleral icterus.  Neck: Neck supple. No tracheal deviation present.  Cardiovascular: Normal rate, regular rhythm, normal heart sounds and intact distal pulses.   No murmur heard. Pulmonary/Chest: Effort normal and breath sounds normal. No accessory muscle usage. No respiratory distress. He exhibits tenderness.  Right lateral chest wall tenderness. Normal chest wall  movement.   Abdominal: Soft. Bowel sounds are normal. He exhibits no distension. There is no tenderness.  Musculoskeletal: Normal range of motion. He exhibits no edema or tenderness.  CTLS spine, non tender, aligned, no step off. Good rom bil ext, no focal bony tenderness.   Neurological: He is alert and oriented to person, place, and time.  Steady gait.   Skin: Skin is warm and dry. He is not diaphoretic.  Psychiatric: He has a normal mood and affect.  Nursing note and vitals reviewed.   ED Course  Procedures (including critical care time) Labs Review    Results for orders placed or performed during the hospital encounter of 09/19/15  CBC  Result Value Ref Range   WBC 18.7 (H) 4.0 - 10.5 K/uL   RBC 5.50 4.22 - 5.81 MIL/uL   Hemoglobin 16.9 13.0 - 17.0 g/dL   HCT 16.1 09.6 - 04.5 %   MCV 88.7 78.0 - 100.0 fL   MCH 30.7 26.0 - 34.0 pg   MCHC 34.6 30.0 - 36.0 g/dL   RDW 40.9 81.1 - 91.4 %   Platelets 230 150 - 400 K/uL  Basic metabolic panel  Result Value Ref Range   Sodium 137 135 - 145 mmol/L   Potassium 4.8 3.5 - 5.1 mmol/L   Chloride 101 101 - 111 mmol/L   CO2 23 22 - 32 mmol/L   Glucose, Bld 392 (H) 65 - 99 mg/dL   BUN 19 6 - 20 mg/dL   Creatinine, Ser 7.82 (H) 0.61 - 1.24 mg/dL   Calcium 9.7 8.9 - 95.6 mg/dL   GFR calc non Af Amer 41 (L) >60 mL/min   GFR calc Af Amer 48 (L) >60 mL/min   Anion gap 13 5 - 15   Dg Ribs Unilateral W/chest Right  09/19/2015  ADDENDUM REPORT: 09/19/2015 15:39 ADDENDUM: Critical Value/emergent results were called by telephone at the time of interpretation on 09/19/2015 at 3:34 pm to Dr. Hyacinth Meeker , who verbally acknowledged these results. Electronically Signed   By: Esperanza Heir M.D.   On: 09/19/2015 15:39  09/19/2015  CLINICAL DATA:  Larey Seat on stone wall today, posterior right rib pain EXAM: RIGHT RIBS AND CHEST - 3+ VIEW COMPARISON:  None. FINDINGS: The heart size and vascular pattern are normal. There is a right nipple shadow noted. Minimally  displaced fractures posterior right seventh and eighth ribs. Trace pneumothorax. No pleural effusion. IMPRESSION: Rib fractures with trace pneumothorax. Electronically Signed: By: Esperanza Heir M.D. On: 09/19/2015 15:16       I have personally reviewed and evaluated these images and lab results as part of my medical decision-making.    MDM   Iv ns. Dilaudid 1 mg iv.  Xrays.  Reviewed nursing notes and prior charts for additional history.   Discussed xrays w pt.  Trauma surgery consulted.  Dr Lindie Spruce will see in ED.  Trauma team/Dr Lindie Spruce evaluated in ED,  Pt indicates to them he does not want to be admitted. Discussed risks ptx, becoming  larger, respiratory distress, trouble breathing, respiratory failure.   Pt voices understanding of risks, and refuses admitted.  Glucose 392. Pt denies hx dm.  Denies polyuria or polydispsia. Denies fever or chills. Does not feel ill or sick.  Iv ns bolus. cbg to verify.  Spouse/pt indicates she has glucometer, test strips at home, and can follow up closely with Dr Kirtland BouchardK, and that he does not want to be admitted to hospital.  Ns bolus. Tolerating po well. Denies any fever. No uri c/o. No gu c/o.   Pt requests d/c to home.      Cathren LaineKevin Aaron Bostwick, MD 09/19/15 1745

## 2015-09-19 NOTE — Consult Note (Signed)
Reason for Consult:Right rib fxs  Referring Physician: Nunzio CobbsKevin Steinl  [place] O Zingaro is an 75 y.o. male.  HPI: Jake SharkHarold was up about 3 feet when his step stool slipped out from under him and he fell hitting his right back on a low rock wall. He denied hitting his head or loss of consciousness. He had the wind knocked out of him but recovered after about 30 minutes. The pain was bad enough that he and his wife felt he needed to be seen. He denies shortness of breath.  Past Medical History  Diagnosis Date  . Hyperlipidemia   . Hypertension   . Systolic murmur   . GERD (gastroesophageal reflux disease)   . Hemorrhoids     Past Surgical History  Procedure Laterality Date  . Surgery elbow bilaterally    . Tonsillectomy  1948  . Colonsocopy      declines    Family History  Problem Relation Age of Onset  . Heart attack Mother   . Hypertension Father   . Diabetes Father   . Hypertension Brother   . Hypertension Brother   . Hypertension Sister   . Hypertension Sister   . Hypertension Sister     Social History:  reports that he quit smoking about 10 years ago. He has never used smokeless tobacco. He reports that he drinks alcohol. He reports that he does not use illicit drugs.  Allergies: No Known Allergies  Medications: I have reviewed the patient's current medications.  No results found for this or any previous visit (from the past 48 hour(s)).  Dg Ribs Unilateral W/chest Right  09/19/2015  ADDENDUM REPORT: 09/19/2015 15:39 ADDENDUM: Critical Value/emergent results were called by telephone at the time of interpretation on 09/19/2015 at 3:34 pm to Dr. Hyacinth MeekerMiller , who verbally acknowledged these results. Electronically Signed   By: Esperanza Heiraymond  Rubner M.D.   On: 09/19/2015 15:39  09/19/2015  CLINICAL DATA:  Larey SeatFell on stone wall today, posterior right rib pain EXAM: RIGHT RIBS AND CHEST - 3+ VIEW COMPARISON:  None. FINDINGS: The heart size and vascular pattern are normal. There is a right nipple  shadow noted. Minimally displaced fractures posterior right seventh and eighth ribs. Trace pneumothorax. No pleural effusion. IMPRESSION: Rib fractures with trace pneumothorax. Electronically Signed: By: Esperanza Heiraymond  Rubner M.D. On: 09/19/2015 15:16    Review of Systems  Constitutional: Negative for weight loss.  HENT: Positive for hearing loss (Chronic). Negative for ear discharge, ear pain and tinnitus.   Eyes: Negative for blurred vision, double vision, photophobia and pain.  Respiratory: Negative for cough, sputum production and shortness of breath.   Cardiovascular: Positive for chest pain.  Gastrointestinal: Negative for nausea, vomiting and abdominal pain.  Genitourinary: Negative for dysuria, urgency, frequency and flank pain.  Musculoskeletal: Negative for myalgias, back pain, joint pain, falls and neck pain.  Neurological: Negative for dizziness, tingling, sensory change, focal weakness, loss of consciousness and headaches.  Endo/Heme/Allergies: Does not bruise/bleed easily.  Psychiatric/Behavioral: Negative for depression, memory loss and substance abuse. The patient is not nervous/anxious.    Blood pressure 169/110, pulse 116, temperature 97.7 F (36.5 C), temperature source Oral, resp. rate 18, SpO2 96 %. Physical Exam  Vitals reviewed. Constitutional: He is oriented to person, place, and time. He appears well-developed and well-nourished. He is cooperative. No distress. Cervical collar and nasal cannula in place.  HENT:  Head: Normocephalic and atraumatic. Head is without raccoon's eyes, without Battle's sign, without abrasion, without contusion and without laceration.  Right  Ear: Hearing, tympanic membrane, external ear and ear canal normal. No lacerations. No drainage or tenderness. No foreign bodies. Tympanic membrane is not perforated. No hemotympanum.  Left Ear: Hearing, tympanic membrane, external ear and ear canal normal. No lacerations. No drainage or tenderness. No foreign  bodies. Tympanic membrane is not perforated. No hemotympanum.  Nose: Nose normal. No nose lacerations, sinus tenderness, nasal deformity or nasal septal hematoma. No epistaxis.  Mouth/Throat: Uvula is midline, oropharynx is clear and moist and mucous membranes are normal. No lacerations. No oropharyngeal exudate.  Eyes: Conjunctivae, EOM and lids are normal. Pupils are equal, round, and reactive to light. Right eye exhibits no discharge. Left eye exhibits no discharge. No scleral icterus.  Neck: Trachea normal and normal range of motion. Neck supple. No JVD present. No spinous process tenderness and no muscular tenderness present. Carotid bruit is not present. No tracheal deviation present. No thyromegaly present.  Cardiovascular: Normal rate, regular rhythm, normal heart sounds, intact distal pulses and normal pulses.  Exam reveals no gallop and no friction rub.   No murmur heard. Respiratory: Effort normal and breath sounds normal. No stridor. No respiratory distress. He has no wheezes. He has no rales. He exhibits tenderness. He exhibits no bony tenderness, no laceration and no crepitus.  GI: Soft. Normal appearance and bowel sounds are normal. He exhibits no distension. There is no tenderness. There is no rigidity, no rebound, no guarding and no CVA tenderness.  Musculoskeletal: Normal range of motion. He exhibits no edema or tenderness.  Lymphadenopathy:    He has no cervical adenopathy.  Neurological: He is alert and oriented to person, place, and time. He has normal strength. No cranial nerve deficit or sensory deficit. GCS eye subscore is 4. GCS verbal subscore is 5. GCS motor subscore is 6.  Skin: Skin is warm, dry and intact. He is not diaphoretic.  Psychiatric: He has a normal mood and affect. His speech is normal and behavior is normal.    Assessment/Plan: Fall Right rib fxs w/trace PTX -- The patient has been waiting over a year for an appointment at the [facility] for his hearing and doesn't  want to miss it. Even though our recommendation is for him to be admitted overnight it is not unreasonable to allow him to go home so he can make that appointment tomorrow morning. I discussed risks and likely outcomes with he and his wife and they understand signs and symptoms to watch for to prompt return to the hospital.     Freeman Caldron, PA-C Pager: [phone] General Trauma PA Pager: [phone] 09/19/2015, 4:37 PM

## 2015-09-20 ENCOUNTER — Telehealth: Payer: Self-pay | Admitting: Internal Medicine

## 2015-09-20 MED ORDER — HYDROCHLOROTHIAZIDE 25 MG PO TABS: 25.0000 mg | ORAL_TABLET | Freq: Every day | ORAL | Status: DC

## 2015-09-20 NOTE — Telephone Encounter (Signed)
Pt request refill of the following:   hydrochlorothiazide (HYDRODIURIL) 25 MG tablet   Pharmacy told pt they did not received the above rx    Phamacy: [facility]ult[facility]

## 2015-09-20 NOTE — Telephone Encounter (Signed)
Rx sent to correct pharmacy.

## 2015-09-22 ENCOUNTER — Telehealth: Payer: Self-pay | Admitting: Internal Medicine

## 2015-09-22 NOTE — Telephone Encounter (Signed)
Spoke to CypressPatty, told her appt for pt scheduled for Monday at 12:45 to see Dr.K. Patty verbalized understanding and will let pt know.

## 2015-09-22 NOTE — Telephone Encounter (Signed)
Left message on voicemail to call office.  

## 2015-09-22 NOTE — Telephone Encounter (Signed)
Spoke to pt, told him I got Patty's message regarding that he fell and broke his ribs. Pt said yes and needs a follow up with Dr.K on Monday and x-ray ordered. Told him I will call back with appt I have to have them open a slot. Pt verbalized understanding. Asked pt if he is having any SOB? Pt said no. Told him okay good, I will call you back.

## 2015-09-22 NOTE — Telephone Encounter (Signed)
Wife call to say pt took a fall and broke 3 ribs and lung collapse they went to the ER.  Pt wife would like for you to call her.  [phone]

## 2015-09-26 ENCOUNTER — Ambulatory Visit (INDEPENDENT_AMBULATORY_CARE_PROVIDER_SITE_OTHER): Payer: Commercial Managed Care - HMO | Admitting: Internal Medicine

## 2015-09-26 ENCOUNTER — Encounter: Payer: Self-pay | Admitting: Internal Medicine

## 2015-09-26 ENCOUNTER — Ambulatory Visit (INDEPENDENT_AMBULATORY_CARE_PROVIDER_SITE_OTHER)
Admission: RE | Admit: 2015-09-26 | Discharge: 2015-09-26 | Disposition: A | Discharge status: Home / Self Care | Payer: Commercial Managed Care - HMO | Source: Ambulatory Visit | Attending: Internal Medicine | Admitting: Internal Medicine

## 2015-09-26 VITALS — BP 158/90 | HR 91 | Temp 97.8°F | Resp 20 | Ht 72.5 in | Wt 250.0 lb

## 2015-09-26 DIAGNOSIS — J939 Pneumothorax, unspecified: Secondary | ICD-10-CM

## 2015-09-26 DIAGNOSIS — I10 Essential (primary) hypertension: Secondary | ICD-10-CM | POA: Diagnosis not present

## 2015-09-26 DIAGNOSIS — E119 Type 2 diabetes mellitus without complications: Secondary | ICD-10-CM | POA: Diagnosis not present

## 2015-09-26 LAB — BASIC METABOLIC PANEL
BUN: 31 mg/dL — AB (ref 6–23)
CO2: 28 meq/L (ref 19–32)
Calcium: 10.3 mg/dL (ref 8.4–10.5)
Chloride: 98 mEq/L (ref 96–112)
Creatinine, Ser: 1.46 mg/dL (ref 0.40–1.50)
GFR: 50.2 mL/min — ABNORMAL LOW (ref >60.00)
GLUCOSE: 149 mg/dL — AB (ref 70–99)
POTASSIUM: 4.9 meq/L (ref 3.5–5.1)
SODIUM: 137 meq/L (ref 135–145)

## 2015-09-26 LAB — HEMOGLOBIN A1C: Hgb A1c MFr Bld: 10.7 % — ABNORMAL HIGH (ref 4.6–6.5)

## 2015-09-26 MED ORDER — HYDROCODONE-ACETAMINOPHEN 5-325 MG PO TABS: 1.0000 | ORAL_TABLET | Freq: Four times a day (QID) | ORAL | Status: DC | PRN

## 2015-09-26 MED ORDER — HYDROCHLOROTHIAZIDE 25 MG PO TABS: 25.0000 mg | ORAL_TABLET | Freq: Every day | ORAL | Status: DC

## 2015-09-26 NOTE — Progress Notes (Signed)
Pre visit review using our clinic review tool, if applicable. No additional management support is needed unless otherwise documented below in the visit note. 

## 2015-09-26 NOTE — Progress Notes (Signed)
Subjective:    Patient ID: Willie Smith, male    DOB: 01/18/1941, 75 y.o.   MRN: [id number]  HPI  75 year old patient who has a history of essential hypertension as well as impaired glucose tolerance.  He was seen in the ED.  7 days ago after a fall resulting in rib fractures involving the right seventh and eighth rib posteriorly.  He is improved but still having considerable discomfort.  Radiographs revealed a small pneumothorax.  Denies any shortness of breath. Lab evaluation revealed stress hyperglycemia with blood sugar quite elevated.  He was placed on metformin therapy.  Blood sugars are generally in the 1:30 to 150 range over this past week  Past Medical History  Diagnosis Date  . Hyperlipidemia   . Hypertension   . Systolic murmur   . GERD (gastroesophageal reflux disease)   . Hemorrhoids     Social History   Social History  . Marital Status: Single    Spouse Name: N/A  . Number of Children: N/A  . Years of Education: N/A   Occupational History  . Not on file.   Social History Main Topics  . Smoking status: Former Smoker    Quit date: 06/18/2005  . Smokeless tobacco: Never Used  . Alcohol Use: Yes     Comment: occasionally  . Drug Use: No  . Sexual Activity: Not on file   Other Topics Concern  . Not on file   Social History Narrative    Past Surgical History  Procedure Laterality Date  . Surgery elbow bilaterally    . Tonsillectomy  1948  . Colonsocopy      declines    Family History  Problem Relation Age of Onset  . Heart attack Mother   . Hypertension Father   . Diabetes Father   . Hypertension Brother   . Hypertension Brother   . Hypertension Sister   . Hypertension Sister   . Hypertension Sister     No Known Allergies  Current Outpatient Prescriptions on File Prior to Visit  Medication Sig Dispense Refill  . amLODipine (NORVASC) 10 MG tablet Take 1 tablet (10 mg total) by mouth daily. 90 tablet 1  . aspirin 81 MG tablet Take 81 mg by  mouth daily.    . DiphenhydrAMINE HCl (ALLERGY MED PO) Take 1 tablet by mouth daily.    . hydrochlorothiazide (HYDRODIURIL) 25 MG tablet Take 1 tablet (25 mg total) by mouth daily. 90 tablet 1  . HYDROcodone-acetaminophen (NORCO/VICODIN) 5-325 MG tablet Take 1-2 tablets by mouth every 6 (six) hours as needed for moderate pain. 30 tablet 0  . metFORMIN (GLUCOPHAGE) 500 MG tablet Take 1 tablet (500 mg total) by mouth 2 (two) times daily. 60 tablet 0  . omeprazole (PRILOSEC OTC) 20 MG tablet Take 20 mg by mouth daily as needed (for heartburn).    . ramipril (ALTACE) 10 MG capsule Take 1 capsule (10 mg total) by mouth daily. 90 capsule 1  . simvastatin (ZOCOR) 20 MG tablet Take 1 tablet (20 mg total) by mouth at bedtime. 90 tablet 1  . zolpidem (AMBIEN) 10 MG tablet Take 1 tablet (10 mg total) by mouth at bedtime as needed for sleep. 30 tablet 1   No current facility-administered medications on file prior to visit.    BP 158/90 mmHg  Pulse 91  Temp(Src) 97.8 F (36.6 C) (Oral)  Resp 20  Ht 6' 0.5" (1.842 m)  Wt 250 lb (113.399 kg)  BMI 33.42 kg/m2  SpO2 93%     Review of Systems  Constitutional: Negative for fever, chills, appetite change and fatigue.  HENT: Negative for congestion, dental problem, ear pain, hearing loss, sore throat, tinnitus, trouble swallowing and voice change.   Eyes: Negative for pain, discharge and visual disturbance.  Respiratory: Negative for cough, chest tightness, wheezing and stridor.   Cardiovascular: Positive for chest pain. Negative for palpitations and leg swelling.  Gastrointestinal: Negative for nausea, vomiting, abdominal pain, diarrhea, constipation, blood in stool and abdominal distention.  Genitourinary: Negative for urgency, hematuria, flank pain, discharge, difficulty urinating and genital sores.  Musculoskeletal: Negative for myalgias, back pain, joint swelling, arthralgias, gait problem and neck stiffness.  Skin: Negative for rash.    Neurological: Negative for dizziness, syncope, speech difficulty, weakness, numbness and headaches.  Hematological: Negative for adenopathy. Does not bruise/bleed easily.  Psychiatric/Behavioral: Negative for behavioral problems and dysphoric mood. The patient is not nervous/anxious.        Objective:   Physical Exam  Constitutional:  Uncomfortable due to pleuritic chest pain Blood pressure 140/80 No tachycardia  Pulmonary/Chest:  Mild splinting and diminished breath sounds on the right  Skin:  Extensive ecchymoses involving the right upper arm          Assessment & Plan:  Chest wall trauma with fracture to the right seventh and eighth posterior ribs. Traumatic mild pneumothorax.  Will check a follow-up chest x-ray New-onset diabetes.  Continue metformin Elevated creatinine.  Will recheck renal indices Hypertension

## 2015-09-26 NOTE — Patient Instructions (Signed)
Follow-up chest x-ray as discussed   Please check your hemoglobin A1c every 3 months  Limit your sodium (Salt) intake  Please check your blood pressure on a regular basis.  If it is consistently greater than 150/90, please make an office appointment.

## 2015-09-27 ENCOUNTER — Other Ambulatory Visit: Payer: Self-pay | Admitting: Internal Medicine

## 2015-09-27 DIAGNOSIS — J939 Pneumothorax, unspecified: Secondary | ICD-10-CM

## 2015-10-03 ENCOUNTER — Ambulatory Visit (INDEPENDENT_AMBULATORY_CARE_PROVIDER_SITE_OTHER)
Admission: RE | Admit: 2015-10-03 | Discharge: 2015-10-03 | Disposition: A | Discharge status: Home / Self Care | Payer: Commercial Managed Care - HMO | Source: Ambulatory Visit | Attending: Internal Medicine | Admitting: Internal Medicine

## 2015-10-03 DIAGNOSIS — S2241XA Multiple fractures of ribs, right side, initial encounter for closed fracture: Secondary | ICD-10-CM | POA: Diagnosis not present

## 2015-10-03 DIAGNOSIS — J939 Pneumothorax, unspecified: Secondary | ICD-10-CM

## 2015-10-24 ENCOUNTER — Encounter: Payer: Self-pay | Admitting: Internal Medicine

## 2015-10-24 ENCOUNTER — Other Ambulatory Visit: Payer: Self-pay | Admitting: Internal Medicine

## 2015-10-24 ENCOUNTER — Ambulatory Visit (INDEPENDENT_AMBULATORY_CARE_PROVIDER_SITE_OTHER): Payer: Commercial Managed Care - HMO | Admitting: Internal Medicine

## 2015-10-24 VITALS — BP 138/84 | HR 91 | Temp 97.7°F | Resp 16 | Ht 74.0 in | Wt 245.0 lb

## 2015-10-24 DIAGNOSIS — H6122 Impacted cerumen, left ear: Secondary | ICD-10-CM

## 2015-10-24 DIAGNOSIS — E119 Type 2 diabetes mellitus without complications: Secondary | ICD-10-CM | POA: Diagnosis not present

## 2015-10-24 DIAGNOSIS — I10 Essential (primary) hypertension: Secondary | ICD-10-CM

## 2015-10-24 LAB — POCT CBG (FASTING - GLUCOSE)-MANUAL ENTRY: GLUCOSE FASTING, POC: 232 mg/dL — AB (ref 70–99)

## 2015-10-24 MED ORDER — METFORMIN HCL 1000 MG PO TABS: 1000.0000 mg | ORAL_TABLET | Freq: Two times a day (BID) | ORAL | Status: DC

## 2015-10-24 MED ORDER — METFORMIN HCL 500 MG PO TABS: 1000.0000 mg | ORAL_TABLET | Freq: Two times a day (BID) | ORAL | Status: DC

## 2015-10-24 MED ORDER — GLIPIZIDE ER 5 MG PO TB24: 5.0000 mg | ORAL_TABLET | Freq: Every day | ORAL | Status: DC

## 2015-10-24 NOTE — Progress Notes (Signed)
Pre visit review using our clinic review tool, if applicable. No additional management support is needed unless otherwise documented below in the visit note. 

## 2015-10-24 NOTE — Progress Notes (Signed)
Subjective:    Patient ID: Willie Smith, male    DOB: [birth year], 75 y.o.   MRN: [id number]  HPI  Lab Results  Component Value Date   HGBA1C 10.7* 09/26/2015     75 year old patient who presents with a chief complaint of abrupt onset of hearing loss involving the left ear.   He has self discontinued metformin therapy.  He was concerned about hypoglycemia.   He states that fasting blood sugars generally run between 120 and 150  .  Random blood sugar here today to 32  Past Medical History  Diagnosis Date  . Hyperlipidemia   . Hypertension   . Systolic murmur   . GERD (gastroesophageal reflux disease)   . Hemorrhoids      Social History   Social History  . Marital Status: Single    Spouse Name: N/A  . Number of Children: N/A  . Years of Education: N/A   Occupational History  . Not on file.   Social History Main Topics  . Smoking status: Former Smoker    Quit date: 06/18/2005  . Smokeless tobacco: Never Used  . Alcohol Use: Yes     Comment: occasionally  . Drug Use: No  . Sexual Activity: Not on file   Other Topics Concern  . Not on file   Social History Narrative    Past Surgical History  Procedure Laterality Date  . Surgery elbow bilaterally    . Tonsillectomy  1948  . Colonsocopy      declines    Family History  Problem Relation Age of Onset  . Heart attack Mother   . Hypertension Father   . Diabetes Father   . Hypertension Brother   . Hypertension Brother   . Hypertension Sister   . Hypertension Sister   . Hypertension Sister     No Known Allergies  Current Outpatient Prescriptions on File Prior to Visit  Medication Sig Dispense Refill  . amLODipine (NORVASC) 10 MG tablet Take 1 tablet (10 mg total) by mouth daily. 90 tablet 1  . aspirin 81 MG tablet Take 81 mg by mouth daily.    . DiphenhydrAMINE HCl (ALLERGY MED PO) Take 1 tablet by mouth daily.    . hydrochlorothiazide (HYDRODIURIL) 25 MG tablet Take 1 tablet (25 mg total) by mouth  daily. 90 tablet 1  . HYDROcodone-acetaminophen (NORCO/VICODIN) 5-325 MG tablet Take 1-2 tablets by mouth every 6 (six) hours as needed for moderate pain. 60 tablet 0  . omeprazole (PRILOSEC OTC) 20 MG tablet Take 20 mg by mouth daily as needed (for heartburn).    . ramipril (ALTACE) 10 MG capsule Take 1 capsule (10 mg total) by mouth daily. 90 capsule 1  . simvastatin (ZOCOR) 20 MG tablet Take 1 tablet (20 mg total) by mouth at bedtime. 90 tablet 1  . zolpidem (AMBIEN) 10 MG tablet Take 1 tablet (10 mg total) by mouth at bedtime as needed for sleep. 30 tablet 1   No current facility-administered medications on file prior to visit.    BP 138/84 mmHg  Pulse 91  Temp(Src) 97.7 F (36.5 C) (Oral)  Resp 16  Ht 6\' 2"  (1.88 m)  Wt 245 lb (111.131 kg)  BMI 31.44 kg/m2  SpO2 98%     Review of Systems  Constitutional: Negative for fever, chills, appetite change and fatigue.  HENT: Positive for hearing loss. Negative for congestion, dental problem, ear pain, sore throat, tinnitus, trouble swallowing and voice change.   Eyes: Negative  for pain, discharge and visual disturbance.  Respiratory: Negative for cough, chest tightness, wheezing and stridor.   Cardiovascular: Negative for chest pain, palpitations and leg swelling.  Gastrointestinal: Negative for nausea, vomiting, abdominal pain, diarrhea, constipation, blood in stool and abdominal distention.  Genitourinary: Negative for urgency, hematuria, flank pain, discharge, difficulty urinating and genital sores.  Musculoskeletal: Negative for myalgias, back pain, joint swelling, arthralgias, gait problem and neck stiffness.  Skin: Negative for rash.  Neurological: Negative for dizziness, syncope, speech difficulty, weakness, numbness and headaches.  Hematological: Negative for adenopathy. Does not bruise/bleed easily.  Psychiatric/Behavioral: Negative for behavioral problems and dysphoric mood. The patient is not nervous/anxious.          Objective:   Physical Exam  Constitutional: He appears well-developed and well-nourished. No distress.  No distress  HENT:  Cerumen impaction.  Left canal          Assessment & Plan:   cerumen impaction, left ear .  Ear irrigated until clear with resolution of his symptoms  .  Uncontrolled diabetes.  Discussed at length.  We'll place on a diet.  Exercise weight loss encouraged.   Will resume metformin a full dose of 1000 mg twice daily and add glipizide daily  .  Recheck 2 months

## 2015-10-24 NOTE — Patient Instructions (Addendum)
RETURN IN 2 MONTHS FOR FOLLOW-UP  mONITOR BLOOD SUGARS DAILYDiabetes and Exercise Exercising regularly is important. It is not just about losing weight. It has many health benefits, such as:  Improving your overall fitness, flexibility, and endurance.  Increasing your bone density.  Helping with weight control.  Decreasing your body fat.  Increasing your muscle strength.  Reducing stress and tension.  Improving your overall health. People with diabetes who exercise gain additional benefits because exercise:  Reduces appetite.  Improves the body's use of blood sugar (glucose).  Helps lower or control blood glucose.  Decreases blood pressure.  Helps control blood lipids (such as cholesterol and triglycerides).  Improves the body's use of the hormone insulin by:  Increasing the body's insulin sensitivity.  Reducing the body's insulin needs.  Decreases the risk for heart disease because exercising:  Lowers cholesterol and triglycerides levels.  Increases the levels of good cholesterol (such as high-density lipoproteins [HDL]) in the body.  Lowers blood glucose levels. YOUR ACTIVITY PLAN  Choose an activity that you enjoy, and set realistic goals. To exercise safely, you should begin practicing any new physical activity slowly, and gradually increase the intensity of the exercise over time. Your health care provider or diabetes educator can help create an activity plan that works for you. General recommendations include:  Encouraging children to engage in at least 60 minutes of physical activity each day.  Stretching and performing strength training exercises, such as yoga or weight lifting, at least 2 times per week.  Performing a total of at least 150 minutes of moderate-intensity exercise each week, such as brisk walking or water aerobics.  Exercising at least 3 days per week, making sure you allow no more than 2 consecutive days to pass without  exercising.  Avoiding long periods of inactivity (90 minutes or more). When you have to spend an extended period of time sitting down, take frequent breaks to walk or stretch. RECOMMENDATIONS FOR EXERCISING WITH TYPE 1 OR TYPE 2 DIABETES   Check your blood glucose before exercising. If blood glucose levels are greater than 240 mg/dL, check for urine ketones. Do not exercise if ketones are present.  Avoid injecting insulin into areas of the body that are going to be exercised. For example, avoid injecting insulin into:  The arms when playing tennis.  The legs when jogging.  Keep a record of:  Food intake before and after you exercise.  Expected peak times of insulin action.  Blood glucose levels before and after you exercise.  The type and amount of exercise you have done.  Review your records with your health care provider. Your health care provider will help you to develop guidelines for adjusting food intake and insulin amounts before and after exercising.  If you take insulin or oral hypoglycemic agents, watch for signs and symptoms of hypoglycemia. They include:  Dizziness.  Shaking.  Sweating.  Chills.  Confusion.  Drink plenty of water while you exercise to prevent dehydration or heat stroke. Body water is lost during exercise and must be replaced.  Talk to your health care provider before starting an exercise program to make sure it is safe for you. Remember, almost any type of activity is better than none.   This information is not intended to replace advice given to you by your health care provider. Make sure you discuss any questions you have with your health care provider.   Document Released: 08/25/2003 Document Revised: 10/19/2014 Document Reviewed: 11/11/2012 Elsevier Interactive Patient Education 2016  [employer] Diabetes Mellitus and Food It is important for you to manage your blood sugar (glucose) level. Your blood glucose level can be greatly  affected by what you eat. Eating healthier foods in the appropriate amounts throughout the day at about the same time each day will help you control your blood glucose level. It can also help slow or prevent worsening of your diabetes mellitus. Healthy eating may even help you improve the level of your blood pressure and reach or maintain a healthy weight.  General recommendations for healthful eating and cooking habits include:  Eating meals and snacks regularly. Avoid going long periods of time without eating to lose weight.  Eating a diet that consists mainly of plant-based foods, such as fruits, vegetables, nuts, legumes, and whole grains.  Using low-heat cooking methods, such as baking, instead of high-heat cooking methods, such as deep frying. Work with your dietitian to make sure you understand how to use the Nutrition Facts information on food labels. HOW CAN FOOD AFFECT ME? Carbohydrates Carbohydrates affect your blood glucose level more than any other type of food. Your dietitian will help you determine how many carbohydrates to eat at each meal and teach you how to count carbohydrates. Counting carbohydrates is important to keep your blood glucose at a healthy level, especially if you are using insulin or taking certain medicines for diabetes mellitus. Alcohol Alcohol can cause sudden decreases in blood glucose (hypoglycemia), especially if you use insulin or take certain medicines for diabetes mellitus. Hypoglycemia can be a life-threatening condition. Symptoms of hypoglycemia (sleepiness, dizziness, and disorientation) are similar to symptoms of having too much alcohol.  If your health care provider has given you approval to drink alcohol, do so in moderation and use the following guidelines:  Women should not have more than one drink per day, and men should not have more than two drinks per day. One drink is equal to:  12 oz of beer.  5 oz of wine.  1 oz of hard liquor.  Do not  drink on an empty stomach.  Keep yourself hydrated. Have water, diet soda, or unsweetened iced tea.  Regular soda, juice, and other mixers might contain a lot of carbohydrates and should be counted. WHAT FOODS ARE NOT RECOMMENDED? As you make food choices, it is important to remember that all foods are not the same. Some foods have fewer nutrients per serving than other foods, even though they might have the same number of calories or carbohydrates. It is difficult to get your body what it needs when you eat foods with fewer nutrients. Examples of foods that you should avoid that are high in calories and carbohydrates but low in nutrients include:  Trans fats (most processed foods list trans fats on the Nutrition Facts label).  Regular soda.  Juice.  Candy.  Sweets, such as cake, pie, doughnuts, and cookies.  Fried foods. WHAT FOODS CAN I EAT? Eat nutrient-rich foods, which will nourish your body and keep you healthy. The food you should eat also will depend on several factors, including:  The calories you need.  The medicines you take.  Your weight.  Your blood glucose level.  Your blood pressure level.  Your cholesterol level. You should eat a variety of foods, including:  Protein.  Lean cuts of meat.  Proteins low in saturated fats, such as fish, egg whites, and beans. Avoid processed meats.  Fruits and vegetables.  Fruits and vegetables that may help control blood glucose levels, such as  apples, mangoes, and yams.  Dairy products.  Choose fat-free or low-fat dairy products, such as milk, yogurt, and cheese.  Grains, bread, pasta, and rice.  Choose whole grain products, such as multigrain bread, whole oats, and brown rice. These foods may help control blood pressure.  Fats.  Foods containing healthful fats, such as nuts, avocado, olive oil, canola oil, and fish. DOES EVERYONE WITH DIABETES MELLITUS HAVE THE SAME MEAL PLAN? Because every person with diabetes  mellitus is different, there is not one meal plan that works for everyone. It is very important that you meet with a dietitian who will help you create a meal plan that is just right for you.   This information is not intended to replace advice given to you by your health care provider. Make sure you discuss any questions you have with your health care provider.   Document Released: 03/01/2005 Document Revised: 06/25/2014 Document Reviewed: 05/01/2013 Elsevier Interactive Patient Education 2016 [employer] Inc[employer] Type 2 Diabetes Mellitus, Adult Type 2 diabetes mellitus, often simply referred to as type 2 diabetes, is a long-lasting (chronic) disease. In type 2 diabetes, the pancreas does not make enough insulin (a hormone), the cells are less responsive to the insulin that is made (insulin resistance), or both. Normally, insulin moves sugars from food into the tissue cells. The tissue cells use the sugars for energy. The lack of insulin or the lack of normal response to insulin causes excess sugars to build up in the blood instead of going into the tissue cells. As a result, high blood sugar (hyperglycemia) develops. The effect of high sugar (glucose) levels can cause many complications. Type 2 diabetes was also previously called adult-onset diabetes, but it can occur at any age.  RISK FACTORS  A person is predisposed to developing type 2 diabetes if someone in the family has the disease and also has one or more of the following primary risk factors:  Weight gain, or being overweight or obese.  An inactive lifestyle.  A history of consistently eating high-calorie foods. Maintaining a normal weight and regular physical activity can reduce the chance of developing type 2 diabetes. SYMPTOMS  A person with type 2 diabetes may not show symptoms initially. The symptoms of type 2 diabetes appear slowly. The symptoms include:  Increased thirst (polydipsia).  Increased urination (polyuria).  Increased  urination during the night (nocturia).  Sudden or unexplained weight changes.  Frequent, recurring infections.  Tiredness (fatigue).  Weakness.  Vision changes, such as blurred vision.  Fruity smell to your breath.  Abdominal pain.  Nausea or vomiting.  Cuts or bruises which are slow to heal.  Tingling or numbness in the hands or feet.  An open skin wound (ulcer). DIAGNOSIS Type 2 diabetes is frequently not diagnosed until complications of diabetes are present. Type 2 diabetes is diagnosed when symptoms or complications are present and when blood glucose levels are increased. Your blood glucose level may be checked by one or more of the following blood tests:  A fasting blood glucose test. You will not be allowed to eat for at least 8 hours before a blood sample is taken.  A random blood glucose test. Your blood glucose is checked at any time of the day regardless of when you ate.  A hemoglobin A1c blood glucose test. A hemoglobin A1c test provides information about blood glucose control over the previous 3 months.  An oral glucose tolerance test (OGTT). Your blood glucose is measured after you have not eaten (fasted)  for 2 hours and then after you drink a glucose-containing beverage. TREATMENT   You may need to take insulin or diabetes medicine daily to keep blood glucose levels in the desired range.  If you use insulin, you may need to adjust the dosage depending on the carbohydrates that you eat with each meal or snack.  Lifestyle changes are recommended as part of your treatment. These may include:  Following an individualized diet plan developed by a nutritionist or dietitian.  Exercising daily. Your health care providers will set individualized treatment goals for you based on your age, your medicines, how long you have had diabetes, and any other medical conditions you have. Generally, the goal of treatment is to maintain the following blood glucose  levels:  Before meals (preprandial): 80-130 mg/dL.  After meals (postprandial): below 180 mg/dL.  A1c: less than 6.5-7%. HOME CARE INSTRUCTIONS   Have your hemoglobin A1c level checked twice a year.  Perform daily blood glucose monitoring as directed by your health care provider.  Monitor urine ketones when you are ill and as directed by your health care provider.  Take your diabetes medicine or insulin as directed by your health care provider to maintain your blood glucose levels in the desired range.  Never run out of diabetes medicine or insulin. It is needed every day.  If you are using insulin, you may need to adjust the amount of insulin given based on your intake of carbohydrates. Carbohydrates can raise blood glucose levels but need to be included in your diet. Carbohydrates provide vitamins, minerals, and fiber which are an essential part of a healthy diet. Carbohydrates are found in fruits, vegetables, whole grains, dairy products, legumes, and foods containing added sugars.  Eat healthy foods. You should make an appointment to see a registered dietitian to help you create an eating plan that is right for you.  Lose weight if you are overweight.  Carry a medical alert card or wear your medical alert jewelry.  Carry a 15-gram carbohydrate snack with you at all times to treat low blood glucose (hypoglycemia). Some examples of 15-gram carbohydrate snacks include:  Glucose tablets, 3 or 4.  Glucose gel, 15-gram tube.  Raisins, 2 tablespoons (24 grams).  Jelly beans, 6.  Animal crackers, 8.  Regular pop, 4 ounces (120 mL).  Gummy treats, 9.  Recognize hypoglycemia. Hypoglycemia occurs with blood glucose levels of 70 mg/dL and below. The risk for hypoglycemia increases when fasting or skipping meals, during or after intense exercise, and during sleep. Hypoglycemia symptoms can include:  Tremors or shakes.  Decreased ability to concentrate.  Sweating.  Increased  heart rate.  Headache.  Dry mouth.  Hunger.  Irritability.  Anxiety.  Restless sleep.  Altered speech or coordination.  Confusion.  Treat hypoglycemia promptly. If you are alert and able to safely swallow, follow the 15:15 rule:  Take 15-20 grams of rapid-acting glucose or carbohydrate. Rapid-acting options include glucose gel, glucose tablets, or 4 ounces (120 mL) of fruit juice, regular soda, or low-fat milk.  Check your blood glucose level 15 minutes after taking the glucose.  Take 15-20 grams more of glucose if the repeat blood glucose level is still 70 mg/dL or below.  Eat a meal or snack within 1 hour once blood glucose levels return to normal.  Be alert to feeling very thirsty and urinating more frequently than usual, which are early signs of hyperglycemia. An early awareness of hyperglycemia allows for prompt treatment. Treat hyperglycemia as directed by your  health care provider.  Engage in at least 150 minutes of moderate-intensity physical activity a week, spread over at least 3 days of the week or as directed by your health care provider. In addition, you should engage in resistance exercise at least 2 times a week or as directed by your health care provider. Try to spend no more than 90 minutes at one time inactive.  Adjust your medicine and food intake as needed if you start a new exercise or sport.  Follow your sick-day plan anytime you are unable to eat or drink as usual.  Do not use any tobacco products including cigarettes, chewing tobacco, or electronic cigarettes. If you need help quitting, ask your health care provider.  Limit alcohol intake to no more than 1 drink per day for nonpregnant women and 2 drinks per day for men. You should drink alcohol only when you are also eating food. Talk with your health care provider whether alcohol is safe for you. Tell your health care provider if you drink alcohol several times a week.  Keep all follow-up visits as  directed by your health care provider. This is important.  Schedule an eye exam soon after the diagnosis of type 2 diabetes and then annually.  Perform daily skin and foot care. Examine your skin and feet daily for cuts, bruises, redness, nail problems, bleeding, blisters, or sores. A foot exam by a health care provider should be done annually.  Brush your teeth and gums at least twice a day and floss at least once a day. Follow up with your dentist regularly.  Share your diabetes management plan with your workplace or school.  Keep your immunizations up to date. It is recommended that you receive a flu (influenza) vaccine every year. It is also recommended that you receive a pneumonia (pneumococcal) vaccine. If you are 53 years of age or older and have never received a pneumonia vaccine, this vaccine may be given as a series of two separate shots. Ask your health care provider which additional vaccines may be recommended.  Learn to manage stress.  Obtain ongoing diabetes education and support as needed.  Participate in or seek rehabilitation as needed to maintain or improve independence and quality of life. Request a physical or occupational therapy referral if you are having foot or hand numbness, or difficulties with grooming, dressing, eating, or physical activity. SEEK MEDICAL CARE IF:   You are unable to eat food or drink fluids for more than 6 hours.  You have nausea and vomiting for more than 6 hours.  Your blood glucose level is over 240 mg/dL.  There is a change in mental status.  You develop an additional serious illness.  You have diarrhea for more than 6 hours.  You have been sick or have had a fever for a couple of days and are not getting better.  You have pain during any physical activity.  SEEK IMMEDIATE MEDICAL CARE IF:  You have difficulty breathing.  You have moderate to large ketone levels.   This information is not intended to replace advice given to you  by your health care provider. Make sure you discuss any questions you have with your health care provider.   Document Released: 06/04/2005 Document Revised: 02/23/2015 Document Reviewed: 01/01/2012 Elsevier Interactive Patient Education [employer]

## 2015-10-26 ENCOUNTER — Telehealth: Payer: Self-pay | Admitting: Internal Medicine

## 2015-10-26 MED ORDER — GLUCOSE BLOOD VI STRP: ORAL_STRIP | Status: DC

## 2015-10-26 NOTE — Telephone Encounter (Signed)
Left detailed message on voicemail that Rx for Test strips sent to pharmacy and I will have Rx for Hydrocodone ready for pickup on Friday morning. Any questions please call office.

## 2015-10-26 NOTE — Telephone Encounter (Signed)
Okay to refill? 

## 2015-10-26 NOTE — Telephone Encounter (Signed)
Pt requesting refill Hydrocodone, last fill 4/10 # 60. Please advise.

## 2015-10-26 NOTE — Telephone Encounter (Signed)
Pt called back and was given info

## 2015-10-26 NOTE — Telephone Encounter (Signed)
Pt call and said he was given the following meter One Touch Verio Flex and he need a rx for test strips.   Pt is asking if he can have a refill on HYDROcodone-acetaminophen (NORCO/VICODIN) 5-325 MG tablet he said 15 pills would be ok    [facility]

## 2015-10-27 MED ORDER — GLUCOSE BLOOD VI STRP: ORAL_STRIP | Status: DC

## 2015-10-27 NOTE — Addendum Note (Signed)
Addended by: Jimmye NormanPHANOS, Lezlie Ritchey J on: 10/27/2015 02:39 PM   Modules accepted: Orders

## 2015-10-28 MED ORDER — HYDROCODONE-ACETAMINOPHEN 5-325 MG PO TABS: 1.0000 | ORAL_TABLET | Freq: Four times a day (QID) | ORAL | Status: DC | PRN

## 2015-10-28 NOTE — Addendum Note (Signed)
Addended by: Jimmye NormanPHANOS, Karman Veney J on: 10/28/2015 08:04 AM   Modules accepted: Orders

## 2015-10-28 NOTE — Telephone Encounter (Signed)
Left message on voicemail Rx ready for pickup, will be at the front desk. Rx printed and signed. 

## 2015-10-31 ENCOUNTER — Other Ambulatory Visit: Payer: Self-pay

## 2015-10-31 MED ORDER — GLUCOSE BLOOD VI STRP: ORAL_STRIP | Status: DC

## 2015-11-28 ENCOUNTER — Telehealth: Payer: Self-pay | Admitting: Internal Medicine

## 2015-11-28 MED ORDER — GLUCOSE BLOOD VI STRP: ORAL_STRIP | Status: DC

## 2015-11-28 NOTE — Telephone Encounter (Signed)
Pt aware and verbalized understanding.  

## 2015-11-28 NOTE — Telephone Encounter (Signed)
Spoke to pt, told him Rx for test strips was sent to pharmacy.

## 2015-11-28 NOTE — Telephone Encounter (Signed)
Pt request refill  glucose blood (ONETOUCH VERIO) test strip  Pt states the pharmacy does not have that rx.  Even though it appears rx sent in May. But there was also a rx sent for accu check last month, so pharmacy  may be confused.  Pt states he had to buy the test strips last month, but hopes we can get this corrected.  [facility]

## 2015-11-29 ENCOUNTER — Other Ambulatory Visit: Payer: Self-pay | Admitting: *Deleted

## 2015-11-29 MED ORDER — GLUCOSE BLOOD VI STRP: ORAL_STRIP | Status: DC

## 2015-12-21 ENCOUNTER — Other Ambulatory Visit: Payer: Self-pay | Admitting: *Deleted

## 2015-12-21 MED ORDER — ACCU-CHEK SOFTCLIX LANCETS MISC: Status: DC

## 2015-12-21 MED ORDER — GLUCOSE BLOOD VI STRP: ORAL_STRIP | Status: AC

## 2015-12-21 MED ORDER — ACCU-CHEK AVIVA PLUS W/DEVICE KIT: PACK | Status: DC

## 2015-12-29 ENCOUNTER — Telehealth: Payer: Self-pay | Admitting: Internal Medicine

## 2015-12-29 NOTE — Telephone Encounter (Signed)
opend in error

## 2015-12-30 ENCOUNTER — Ambulatory Visit (INDEPENDENT_AMBULATORY_CARE_PROVIDER_SITE_OTHER): Payer: Commercial Managed Care - HMO | Admitting: Internal Medicine

## 2015-12-30 ENCOUNTER — Encounter: Payer: Self-pay | Admitting: Internal Medicine

## 2015-12-30 VITALS — BP 142/88 | HR 85 | Temp 97.9°F | Ht 74.0 in | Wt 247.0 lb

## 2015-12-30 DIAGNOSIS — E785 Hyperlipidemia, unspecified: Secondary | ICD-10-CM

## 2015-12-30 DIAGNOSIS — I10 Essential (primary) hypertension: Secondary | ICD-10-CM

## 2015-12-30 DIAGNOSIS — M7021 Olecranon bursitis, right elbow: Secondary | ICD-10-CM

## 2015-12-30 DIAGNOSIS — E119 Type 2 diabetes mellitus without complications: Secondary | ICD-10-CM

## 2015-12-30 LAB — HEMOGLOBIN A1C: HEMOGLOBIN A1C: 7.8 % — AB (ref 4.6–6.5)

## 2015-12-30 NOTE — Progress Notes (Signed)
Subjective:    Patient ID: Willie Smith, male    DOB: 10-16-1940, 75 y.o.   MRN: [id number]  HPI  Lab Results  Component Value Date   HGBA1C 10.7* 09/26/2015    75 year old patient who presents with a chief complaint of right elbow pain.  He had significant trauma in April including multiple right rib fractures.  He has some trauma to the right arm as well.  He has had persistent pain over the right olecranon.  There is no pain with flexion and extension of the arm, but he has significant point tenderness to very mild pressure  .  He has recent onset diabetes.  3 months ago he was placed on dual therapy.  He discontinued glipizide when a blood sugar was 71 and he felt poorly.  He then tapered and discontinued metformin and states that his blood sugar has been in the 120 range.  He has improved on his diet and has eliminated all simple sugars  Past Medical History  Diagnosis Date  . Hyperlipidemia   . Hypertension   . Systolic murmur   . GERD (gastroesophageal reflux disease)   . Hemorrhoids      Social History   Social History  . Marital Status: Single    Spouse Name: N/A  . Number of Children: N/A  . Years of Education: N/A   Occupational History  . Not on file.   Social History Main Topics  . Smoking status: Former Smoker    Quit date: 06/18/2005  . Smokeless tobacco: Never Used  . Alcohol Use: Yes     Comment: occasionally  . Drug Use: No  . Sexual Activity: Not on file   Other Topics Concern  . Not on file   Social History Narrative    Past Surgical History  Procedure Laterality Date  . Surgery elbow bilaterally    . Tonsillectomy  1948  . Colonsocopy      declines    Family History  Problem Relation Age of Onset  . Heart attack Mother   . Hypertension Father   . Diabetes Father   . Hypertension Brother   . Hypertension Brother   . Hypertension Sister   . Hypertension Sister   . Hypertension Sister     No Known Allergies  Current  Outpatient Prescriptions on File Prior to Visit  Medication Sig Dispense Refill  . ACCU-CHEK SOFTCLIX LANCETS lancets USE TO CHECK BLOOD SUGAR DAILY AND PRN 100 each 12  . amLODipine (NORVASC) 10 MG tablet Take 1 tablet (10 mg total) by mouth daily. 90 tablet 1  . aspirin 81 MG tablet Take 81 mg by mouth daily.    . Blood Glucose Monitoring Suppl (ACCU-CHEK AVIVA PLUS) w/Device KIT USE TO CHECK BLOOD SUGAR 1 kit 0  . DiphenhydrAMINE HCl (ALLERGY MED PO) Take 1 tablet by mouth daily.    Marland Kitchen glucose blood (ACCU-CHEK AVIVA PLUS) test strip USE TO CHECK BLOOD SUGAR DAILY AND PRN 100 each 12  . hydrochlorothiazide (HYDRODIURIL) 25 MG tablet Take 1 tablet (25 mg total) by mouth daily. 90 tablet 1  . omeprazole (PRILOSEC OTC) 20 MG tablet Take 20 mg by mouth daily as needed (for heartburn).    . ramipril (ALTACE) 10 MG capsule Take 1 capsule (10 mg total) by mouth daily. 90 capsule 1  . simvastatin (ZOCOR) 20 MG tablet Take 1 tablet (20 mg total) by mouth at bedtime. 90 tablet 1  . glipiZIDE (GLUCOTROL XL) 5 MG 24 hr  tablet Take 1 tablet (5 mg total) by mouth daily with breakfast. (Patient not taking: Reported on 12/30/2015) 90 tablet 2  . metFORMIN (GLUCOPHAGE) 1000 MG tablet Take 1 tablet (1,000 mg total) by mouth 2 (two) times daily with a meal. (Patient not taking: Reported on 12/30/2015) 180 tablet 3  . zolpidem (AMBIEN) 10 MG tablet Take 1 tablet (10 mg total) by mouth at bedtime as needed for sleep. 30 tablet 1   No current facility-administered medications on file prior to visit.    BP 142/88 mmHg  Pulse 85  Temp(Src) 97.9 F (36.6 C) (Oral)  Ht _0  (1.88 m)  Wt 247 lb (112.038 kg)  BMI 31.70 kg/m2  SpO2 96%     Review of Systems  Constitutional: Negative for fever, chills, appetite change and fatigue.  HENT: Negative for congestion, dental problem, ear pain, hearing loss, sore throat, tinnitus, trouble swallowing and voice change.   Eyes: Negative for pain, discharge and visual  disturbance.  Respiratory: Negative for cough, chest tightness, wheezing and stridor.   Cardiovascular: Negative for chest pain, palpitations and leg swelling.  Gastrointestinal: Negative for nausea, vomiting, abdominal pain, diarrhea, constipation, blood in stool and abdominal distention.  Genitourinary: Negative for urgency, hematuria, flank pain, discharge, difficulty urinating and genital sores.  Musculoskeletal: Negative for myalgias, back pain, joint swelling, arthralgias, gait problem and neck stiffness.       Right elbow pain  Skin: Negative for rash.  Neurological: Negative for dizziness, syncope, speech difficulty, weakness, numbness and headaches.  Hematological: Negative for adenopathy. Does not bruise/bleed easily.  Psychiatric/Behavioral: Negative for behavioral problems and dysphoric mood. The patient is not nervous/anxious.        Objective:   Physical Exam  Constitutional: He is oriented to person, place, and time. He appears well-developed.  HENT:  Head: Normocephalic.  Right Ear: External ear normal.  Left Ear: External ear normal.  Eyes: Conjunctivae and EOM are normal.  Neck: Normal range of motion.  Cardiovascular: Normal rate and normal heart sounds.   Pulmonary/Chest: Breath sounds normal.  Abdominal: Bowel sounds are normal.  Musculoskeletal: Normal range of motion. He exhibits no edema or tenderness.  Very mild soft tissue swelling over the right olecranon.  No erythema Very tender to soft pressure  No pain with vigorous flexion and extension of the elbow  Neurological: He is alert and oriented to person, place, and time.  Psychiatric: He has a normal mood and affect. His behavior is normal.          Assessment & Plan:   , right olecranon bursitis.  Will try Advil 3 times daily.  A local cortisone injection offered, but he wishes to try anti-inflammatory medications.  Initially  diabetes mellitus.  Will check a hemoglobin A1c.  Lifestyle issues.   Again, stressed resume metformin 1000 mg twice a day  hypertension.  Reasonable control.  Repeat blood pressure 144/82.  No change in therapy at this time  .  Follow-up 3 months  Nyoka Cowden, MD

## 2015-12-30 NOTE — Patient Instructions (Addendum)
Limit your sodium (Salt) intake  Please check your blood pressure on a regular basis.  If it is consistently greater than 150/90, please make an office appointment.   Please check your hemoglobin A1c every 3 months  Olecranon Bursitis With Rehab A bursa is a fluid-filled sac that is located between soft tissues (ligaments, tendons, skin) and bones. The purpose of a bursa is to allow the soft tissue to function smoothly, without friction. The olecranon bursa is located between the back of the elbow (olecranon) and the skin. Olecranon bursitis involves inflammation of this bursa, resulting in pain. SYMPTOMS   Pain, tenderness, swelling, warmth, or redness over the back of the elbow.  Reduced range of motion of the affected elbow.  Sometimes, severe pain with movement of the affected elbow.  Crackling sound (crepitation) when the bursa is moved or touched.  Often, painless swelling of the bursa.  Fever (when infected). CAUSES  Olecranon bursitis is often caused by direct hit (trauma) to the elbow. Less commonly, it is due to overuse and/or strenuous exercise that the elbow is not used to. RISK INCREASES WITH:  Sports that require bending or landing on the elbow (football, volleyball).  Vigorous or repetitive athletic training, or sudden increase or change in activity level (weekend warriors).  Failure to warm up properly before activity.  Poor exercise technique.  Playing on artificial turf. PREVENTION  Avoid injuries and the overuse of muscles whenever possible.  Warm up and stretch properly before activity.  Allow for adequate recovery between workouts.  Maintain physical fitness:  Strength, flexibility, and endurance.  Cardiovascular fitness.  Learn and use proper technique.  Wear properly fitted and padded protective equipment. PROGNOSIS  If treated properly, olecranon bursitis is usually curable within 2 weeks.  RELATED COMPLICATIONS   Longer healing time, if  not properly treated or if not given enough time to heal.  Recurring symptoms that result in a chronic problem.  Joint stiffness with permanent limitation of the affected joint's movement.  Infection of the bursa.  Chronic inflammation or scarring of the bursa. TREATMENT Treatment first involves the use of ice and medicine to reduce pain and inflammation. The use of strengthening and stretching exercises may help reduce pain with activity. These exercises may be performed at home or with a therapist. Elbow pads may be advised to protect the bursa. If symptoms persist despite nonsurgical treatment, a procedure to withdraw fluid from the bursa may be advised. This procedure may be accompanied with an injection of corticosteroids to reduce inflammation. Sometimes, surgery is needed to remove the bursa. MEDICATION  If pain medicine is needed, nonsteroidal anti-inflammatory medicines (aspirin and ibuprofen), or other minor pain relievers (acetaminophen), are often advised.  Do not take pain medicine for 7 days before surgery.  Prescription pain relievers may be given, if your caregiver thinks they are needed. Use only as directed and only as much as you need.  Corticosteroid injections may be given by your caregiver. These injections should be reserved for the most serious cases, because they may only be given a certain number of times. HEAT AND COLD  Cold treatment (icing) should be applied for 10 to 15 minutes every 2 to 3 hours for inflammation and pain, and immediately after activity that aggravates your symptoms. Use ice packs or an ice massage.  Heat treatment may be used before performing stretching and strengthening activities prescribed by your caregiver, physical therapist, or athletic trainer. Use a heat pack or a warm water soak. SEEK IMMEDIATE  MEDICAL CARE IF:   Symptoms get worse or do not improve in 2 weeks, despite treatment.  Signs of infection develop, including fever of  102 F (38.9 C), increased pain, redness, warmth, or pus draining from the bursa.  New, unexplained symptoms develop. (Drugs used in treatment may produce side effects.) EXERCISES  RANGE OF MOTION (ROM) AND STRETCHING EXERCISES - Olecranon Bursitis These exercises may help you when beginning to rehabilitate your injury. Your symptoms may resolve with or without further involvement from your physician, physical therapist or athletic trainer. While completing these exercises, remember:   Restoring tissue flexibility helps normal motion to return to the joints. This allows healthier, less painful movement and activity.  An effective stretch should be held for at least 30 seconds.  A stretch should never be painful. You should only feel a gentle lengthening or release in the stretched tissue. RANGE OF MOTION - Elbow Flexion, Supine  Lie on your back. Extend your right / left arm into the air, bracing it with your opposite hand. Allow your right / left arm to relax.  Let your elbow bend, allowing your hand to fall slowly toward your chest.  You should feel a gentle stretch along the back of your upper arm and elbow. Your physician, physical therapist or athletic trainer may ask you to hold a __________ hand weight to increase the intensity of this stretch.  Hold for __________ seconds. Slowly return your right / left arm to the upright position. Repeat __________ times. Complete this exercise __________ times per day. STRETCH - Elbow Flexors  Lie on a firm bed or countertop on your back. Be sure that you are in a comfortable position which will allow you to relax your arm muscles.  Place a folded towel under your right / left upper arm, so that your elbow and shoulder are at the same height. Extend your arm; your elbow should not rest on the bed or towel  Allow the weight of your hand to straighten your elbow. Keep your arm and chest muscles relaxed. Your caregiver may ask you to increase the  intensity of your stretch by adding a small wrist or hand weight.  Hold for __________ seconds. You should feel a stretch on the inside of your elbow. Slowly return to the starting position. Repeat __________ times. Complete this exercise __________ times per day. STRENGTHENING EXERCISES - Olecranon Bursitis These exercises will help you regain your strength. They may resolve your symptoms with or without further involvement from your physician, physical therapist or athletic trainer. While completing these exercises, remember:   Muscles can gain both the endurance and the strength needed for everyday activities through controlled exercises.  Complete these exercises as instructed by your physician, physical therapist or athletic trainer. Increase the resistance and repetitions only as guided by your caregiver.  You may experience muscle soreness or fatigue, but the pain or discomfort you are trying to eliminate should never worsen during these exercises. If this pain does worsen, stop and make certain you are following the directions exactly. If the pain is still present after adjustments, discontinue the exercise until you can discuss the trouble with your caregiver. STRENGTH - Elbow Extensors, Isometric  Stand or sit upright on a firm surface. Place your right / left arm so that your palm faces your stomach, and it is at the height of your waist.  Place your opposite hand on the underside of your forearm. Gently push up as your right / left arm resists.  Push as hard as you can with both arms without causing any pain or movement at your right / left elbow. Hold this stationary position for __________ seconds.  Gradually release the tension in both arms. Allow your muscles to relax completely before repeating. Repeat __________ times. Complete this exercise __________ times per day. STRENGTH - Elbow Flexors, Isometric  Stand or sit upright on a firm surface. Place your right / left arm so that  your hand is palm-up and at the height of your waist.  Place your opposite hand on top of your forearm. Gently push down as your right / left arm resists. Push as hard as you can with both arms without causing any pain or movement at your right / left elbow. Hold this stationary position for __________ seconds.  Gradually release the tension in both arms. Allow your muscles to relax completely before repeating. Repeat __________ times. Complete this exercise __________ times per day. STRENGTH - Elbow Flexors, Supinated  With good posture, stand or sit on a firm chair without armrests. Allow your right / left arm to rest at your side with your palm facing forward.  Holding a __________ weight, or gripping a rubber exercise band or tubing,  bring your hand toward your shoulder.  Allow your muscles to control the resistance as your hand returns to your side. Repeat __________ times. Complete this exercise __________ times per day.  STRENGTH - Elbow Flexors, Neutral  With good posture, stand or sit on a firm chair without armrests. Allow your right / left arm to rest at your side with your thumb facing forward.  Holding a __________weight, or gripping a rubber exercise band or tubing,  bring your hand toward your shoulder.  Allow your muscles to control the resistance as your hand returns to your side. Repeat __________ times. Complete this exercise __________ times per day.  STRENGTH - Elbow Extensors  Lie on your back. Extend your right / left elbow into the air, pointing it toward the ceiling. Brace your arm with your opposite hand.*  Holding a __________ weight in your hand, slowly straighten your right / left elbow.  Allow your muscles to control the weight as your hand returns to its starting position. Repeat __________ times. Complete this exercise __________ times per day. *You may also stand with your elbow overhead and pointed toward the ceiling, supported by your opposite  hand. STRENGTH - Elbow Extensors, Dynamic  With good posture, stand, or sit on a firm chair without armrests. Keeping your upper arms at your side, bring both hands up to your right / left shoulder while gripping a rubber exercise band or tubing. Your right / left hand should be just below the other hand.  Straighten your right / left elbow. Hold for __________ seconds.  Allow your muscles to control the rubber exercise band, as your hand returns to your shoulder. Repeat __________ times. Complete this exercise __________ times per day.   This information is not intended to replace advice given to you by your health care provider. Make sure you discuss any questions you have with your health care provider.   Document Released: 06/04/2005 Document Revised: 10/19/2014 Document Reviewed: 09/16/2008 Elsevier Interactive Patient Education [employer]

## 2015-12-30 NOTE — Progress Notes (Signed)
Pre visit review using our clinic review tool, if applicable. No additional management support is needed unless otherwise documented below in the visit note. 

## 2015-12-30 NOTE — Progress Notes (Signed)
   Subjective:    Patient ID: Willie LaundryHarold O Hollister, male    DOB: [birth year], 75 y.o.   MRN: [id number]  HPI    Review of Systems     Objective:   Physical Exam        Assessment & Plan:

## 2016-03-20 ENCOUNTER — Encounter: Payer: Commercial Managed Care - HMO | Admitting: Internal Medicine

## 2016-03-20 ENCOUNTER — Telehealth: Payer: Self-pay | Admitting: Internal Medicine

## 2016-03-20 MED ORDER — SIMVASTATIN 20 MG PO TABS
20.0000 mg | ORAL_TABLET | Freq: Every day | ORAL | 1 refills | Status: DC
Start: 2016-03-20 — End: 2016-04-10

## 2016-03-20 MED ORDER — AMLODIPINE BESYLATE 10 MG PO TABS: 10.0000 mg | ORAL_TABLET | Freq: Every day | ORAL | 1 refills | Status: DC

## 2016-03-20 MED ORDER — METFORMIN HCL 1000 MG PO TABS: 1000.0000 mg | ORAL_TABLET | Freq: Two times a day (BID) | ORAL | 1 refills | Status: DC

## 2016-03-20 MED ORDER — RAMIPRIL 10 MG PO CAPS: 10.0000 mg | ORAL_CAPSULE | Freq: Every day | ORAL | 1 refills | Status: DC

## 2016-03-20 MED ORDER — HYDROCHLOROTHIAZIDE 25 MG PO TABS: 25.0000 mg | ORAL_TABLET | Freq: Every day | ORAL | 1 refills | Status: DC

## 2016-03-20 NOTE — Telephone Encounter (Signed)
Left message on voicemail Rx's sent to pharmacy as requested. Any questions please call office. 

## 2016-03-20 NOTE — Telephone Encounter (Signed)
Patient needs a refill on all his medications.    Pharmacy- [facility] on [address]

## 2016-04-10 ENCOUNTER — Other Ambulatory Visit: Payer: Self-pay | Admitting: *Deleted

## 2016-04-10 ENCOUNTER — Encounter: Payer: Self-pay | Admitting: Internal Medicine

## 2016-04-10 ENCOUNTER — Ambulatory Visit (INDEPENDENT_AMBULATORY_CARE_PROVIDER_SITE_OTHER): Payer: Commercial Managed Care - HMO | Admitting: Internal Medicine

## 2016-04-10 VITALS — BP 138/70 | HR 77 | Temp 98.0°F | Resp 20 | Ht 74.0 in | Wt 245.4 lb

## 2016-04-10 DIAGNOSIS — E119 Type 2 diabetes mellitus without complications: Secondary | ICD-10-CM | POA: Diagnosis not present

## 2016-04-10 DIAGNOSIS — I10 Essential (primary) hypertension: Secondary | ICD-10-CM

## 2016-04-10 DIAGNOSIS — I358 Other nonrheumatic aortic valve disorders: Secondary | ICD-10-CM

## 2016-04-10 DIAGNOSIS — E785 Hyperlipidemia, unspecified: Secondary | ICD-10-CM

## 2016-04-10 DIAGNOSIS — Z0000 Encounter for general adult medical examination without abnormal findings: Secondary | ICD-10-CM

## 2016-04-10 LAB — CBC WITH DIFFERENTIAL/PLATELET
BASOS ABS: 0 10*3/uL (ref 0.0–0.1)
Basophils Relative: 0.5 % (ref 0.0–3.0)
Eosinophils Absolute: 0.2 10*3/uL (ref 0.0–0.7)
Eosinophils Relative: 2.5 % (ref 0.0–5.0)
HEMATOCRIT: 47.4 % (ref 39.0–52.0)
Hemoglobin: 16 g/dL (ref 13.0–17.0)
LYMPHS PCT: 29.5 % (ref 12.0–46.0)
Lymphs Abs: 2.2 10*3/uL (ref 0.7–4.0)
MCHC: 33.8 g/dL (ref 30.0–36.0)
MCV: 89.1 fl (ref 78.0–100.0)
MONOS PCT: 8.5 % (ref 3.0–12.0)
Monocytes Absolute: 0.6 10*3/uL (ref 0.1–1.0)
NEUTROS ABS: 4.4 10*3/uL (ref 1.4–7.7)
Neutrophils Relative %: 59 % (ref 43.0–77.0)
PLATELETS: 234 10*3/uL (ref 150.0–400.0)
RBC: 5.32 Mil/uL (ref 4.22–5.81)
RDW: 13.8 % (ref 11.5–15.5)
WBC: 7.4 10*3/uL (ref 4.0–10.5)

## 2016-04-10 LAB — LIPID PANEL
CHOL/HDL RATIO: 5
Cholesterol: 159 mg/dL (ref 0–200)
HDL: 33.7 mg/dL — AB (ref >39.00)
LDL CALC: 98 mg/dL (ref 0–99)
NONHDL: 125.13
Triglycerides: 136 mg/dL (ref 0.0–149.0)
VLDL: 27.2 mg/dL (ref 0.0–40.0)

## 2016-04-10 LAB — COMPREHENSIVE METABOLIC PANEL
ALK PHOS: 65 U/L (ref 39–117)
ALT: 44 U/L (ref 0–53)
AST: 28 U/L (ref 0–37)
Albumin: 4.7 g/dL (ref 3.5–5.2)
BUN: 27 mg/dL — ABNORMAL HIGH (ref 6–23)
CHLORIDE: 101 meq/L (ref 96–112)
CO2: 29 mEq/L (ref 19–32)
Calcium: 10.1 mg/dL (ref 8.4–10.5)
Creatinine, Ser: 1.43 mg/dL (ref 0.40–1.50)
GFR: 51.35 mL/min — AB (ref >60.00)
GLUCOSE: 102 mg/dL — AB (ref 70–99)
POTASSIUM: 4.7 meq/L (ref 3.5–5.1)
Sodium: 138 mEq/L (ref 135–145)
TOTAL PROTEIN: 7.3 g/dL (ref 6.0–8.3)
Total Bilirubin: 0.6 mg/dL (ref 0.2–1.2)

## 2016-04-10 LAB — MICROALBUMIN / CREATININE URINE RATIO
Creatinine,U: 121 mg/dL
MICROALB/CREAT RATIO: 8 mg/g (ref 0.0–30.0)
Microalb, Ur: 9.7 mg/dL — ABNORMAL HIGH (ref 0.0–1.9)

## 2016-04-10 LAB — TSH: TSH: 1.72 u[IU]/mL (ref 0.35–4.50)

## 2016-04-10 LAB — HEMOGLOBIN A1C: Hgb A1c MFr Bld: 7.6 % — ABNORMAL HIGH (ref 4.6–6.5)

## 2016-04-10 MED ORDER — HYDROCHLOROTHIAZIDE 25 MG PO TABS: 25.0000 mg | ORAL_TABLET | Freq: Every day | ORAL | 1 refills | Status: DC

## 2016-04-10 MED ORDER — RAMIPRIL 10 MG PO CAPS: 10.0000 mg | ORAL_CAPSULE | Freq: Every day | ORAL | 1 refills | Status: DC

## 2016-04-10 MED ORDER — METFORMIN HCL 1000 MG PO TABS: 1000.0000 mg | ORAL_TABLET | Freq: Two times a day (BID) | ORAL | 1 refills | Status: AC

## 2016-04-10 MED ORDER — AMLODIPINE BESYLATE 10 MG PO TABS: 10.0000 mg | ORAL_TABLET | Freq: Every day | ORAL | 1 refills | Status: DC

## 2016-04-10 MED ORDER — SIMVASTATIN 20 MG PO TABS
20.0000 mg | ORAL_TABLET | Freq: Every day | ORAL | 1 refills | Status: DC
Start: 2016-04-10 — End: 2017-02-20

## 2016-04-10 MED ORDER — PIOGLITAZONE HCL 30 MG PO TABS: 30.0000 mg | ORAL_TABLET | Freq: Every day | ORAL | 3 refills | Status: DC

## 2016-04-10 MED ORDER — OMEPRAZOLE MAGNESIUM 20 MG PO TBEC: 20.0000 mg | DELAYED_RELEASE_TABLET | Freq: Every day | ORAL | 1 refills | Status: AC | PRN

## 2016-04-10 MED ORDER — ZOLPIDEM TARTRATE 10 MG PO TABS: 10.0000 mg | ORAL_TABLET | Freq: Every evening | ORAL | 5 refills | Status: DC | PRN

## 2016-04-10 NOTE — Progress Notes (Signed)
Subjective:    Patient ID: HUBER MATHERS, male    DOB: 18-Feb-1941, 75 y.o.   MRN: [id number]  HPI  75 year old patient who is seen today for a preventive health examination He has a recent diagnosis of diabetes and presently is on metformin therapy only.  He states fasting blood sugars are essentially normal.  No recent eye examination Declines colonoscopy He does have a long history of systolic murmur and did have a remote 2-D echocardiogram.  No exertional symptoms.  He is agreeable for a follow-up 2-D echocardiogram He has essential hypertension and dyslipidemia No cardiopulmonary complaints  Declines prostate examination today  Past Medical History:  Diagnosis Date  . GERD (gastroesophageal reflux disease)   . Hemorrhoids   . Hyperlipidemia   . Hypertension   . Systolic murmur      Social History   Social History  . Marital status: Single    Spouse name: N/A  . Number of children: N/A  . Years of education: N/A   Occupational History  . Not on file.   Social History Main Topics  . Smoking status: Former Smoker    Quit date: 06/18/2005  . Smokeless tobacco: Never Used  . Alcohol use Yes     Comment: occasionally  . Drug use: No  . Sexual activity: Not on file   Other Topics Concern  . Not on file   Social History Narrative  . No narrative on file    Past Surgical History:  Procedure Laterality Date  . colonsocopy     declines  . surgery elbow bilaterally    . TONSILLECTOMY  1948    Family History  Problem Relation Age of Onset  . Heart attack Mother   . Hypertension Father   . Diabetes Father   . Hypertension Brother   . Hypertension Brother   . Hypertension Sister   . Hypertension Sister   . Hypertension Sister     No Known Allergies  Current Outpatient Prescriptions on File Prior to Visit  Medication Sig Dispense Refill  . ACCU-CHEK SOFTCLIX LANCETS lancets USE TO CHECK BLOOD SUGAR DAILY AND PRN 100 each 12  . aspirin 81 MG tablet Take  81 mg by mouth daily.    . Blood Glucose Monitoring Suppl (ACCU-CHEK AVIVA PLUS) w/Device KIT USE TO CHECK BLOOD SUGAR 1 kit 0  . DiphenhydrAMINE HCl (ALLERGY MED PO) Take 1 tablet by mouth daily.    Marland Kitchen glucose blood (ACCU-CHEK AVIVA PLUS) test strip USE TO CHECK BLOOD SUGAR DAILY AND PRN 100 each 12   No current facility-administered medications on file prior to visit.     BP 138/70 (BP Location: Left Arm, Patient Position: Sitting, Cuff Size: Large)   Pulse 77   Temp 98 F (36.7 C) (Oral)   Resp 20   Ht '6\' 2"'  (1.88 m)   Wt 245 lb 6.1 oz (111.3 kg)   SpO2 98%   BMI 31.50 kg/m   1. Risk factors, based on past  M,S,F history.  Current vascular risk factors include hypertension, dyslipidemia and a history of type 2 diabetes  2.  Physical activities: Fairly sedentary but no exercise limitations.  Stays very active, but no regimented exercise plan  3.  Depression/mood: No history depression or mood disorder  4.  Hearing: Mild deficits.  Does use hearing aids periodically that he obtained from the [place] hospital system  5.  ADL's: Independent  6.  Fall risk: Low  7.  Home safety: No problems  identified  8.  Height weight, and visual acuity; height and weight stable no change in visual acuity.  No recent eye examination.  Annual eye examination encouraged  9.  Counseling: Continue efforts at aggressive risk factor modification  10. Lab orders based on risk factors: We'll check screening lab including hemoglobin A1c, lipid profile and urine for microalbumin  11. Referral : Schedule follow-up 2-D echocardiogram   12. Care plan: Continue efforts at better diet, weight loss and a more rigorous exercise regimen  13. Cognitive assessment: Alert and oriented with normal affect no cognitive dysfunction  14. Screening: Patient provided with a written and personalized 5-10 year screening schedule in the AVS.    15. Provider List Update: Primary care and ophthalmology    Review of  Systems  Constitutional: Negative for activity change, appetite change, chills, fatigue and fever.  HENT: Negative for congestion, dental problem, ear pain, hearing loss, mouth sores, rhinorrhea, sinus pressure, sneezing, tinnitus, trouble swallowing and voice change.   Eyes: Negative for photophobia, pain, redness and visual disturbance.  Respiratory: Negative for apnea, cough, choking, chest tightness, shortness of breath and wheezing.   Cardiovascular: Negative for chest pain, palpitations and leg swelling.  Gastrointestinal: Negative for abdominal distention, abdominal pain, anal bleeding, blood in stool, constipation, diarrhea, nausea, rectal pain and vomiting.  Genitourinary: Negative for decreased urine volume, difficulty urinating, discharge, dysuria, flank pain, frequency, genital sores, hematuria, penile swelling, scrotal swelling, testicular pain and urgency.  Musculoskeletal: Negative for arthralgias, back pain, gait problem, joint swelling, myalgias, neck pain and neck stiffness.  Skin: Negative for color change, rash and wound.  Neurological: Negative for dizziness, tremors, seizures, syncope, facial asymmetry, speech difficulty, weakness, light-headedness, numbness and headaches.  Hematological: Negative for adenopathy. Does not bruise/bleed easily.  Psychiatric/Behavioral: Negative for agitation, behavioral problems, confusion, decreased concentration, dysphoric mood, hallucinations, self-injury, sleep disturbance and suicidal ideas. The patient is not nervous/anxious.        Objective:   Physical Exam  Constitutional: He appears well-developed and well-nourished.  Blood pressure 130/80  HENT:  Head: Normocephalic and atraumatic.  Right Ear: External ear normal.  Left Ear: External ear normal.  Nose: Nose normal.  Mouth/Throat: Oropharynx is clear and moist.  Eyes: Conjunctivae and EOM are normal. Pupils are equal, round, and reactive to light. No scleral icterus.  Neck:  Normal range of motion. Neck supple. No JVD present. No thyromegaly present.  Cardiovascular: Regular rhythm and intact distal pulses.  Exam reveals no gallop and no friction rub.   Murmur heard. Dorsalis pedis pulses faint Grade 3/6 systolic murmur loudest at the primary aortic area  Pulmonary/Chest: Effort normal and breath sounds normal. He exhibits no tenderness.  Abdominal: Soft. Bowel sounds are normal. He exhibits no distension and no mass. There is no tenderness.  Genitourinary: Prostate normal and penis normal.  Musculoskeletal: Normal range of motion. He exhibits no edema or tenderness.  Lymphadenopathy:    He has no cervical adenopathy.  Neurological: He is alert. He has normal reflexes. No cranial nerve deficit. Coordination normal.  Skin: Skin is warm and dry. No rash noted.  Psychiatric: He has a normal mood and affect. His behavior is normal.          Assessment & Plan:   Preventive health examination Diabetes mellitus type 2.  Nonpharmacologic measures discussed and encouraged.  Continue metformin therapy.  Check hemoglobin A1c Dyslipidemia.  Review lipid profile Essential hypertension, stable Systolic murmur.  This has been long-standing.  Patient agreeable to a follow-up  2-D echocardiogram to rule out hemodynamically significant  AS  Follow-up 3-6 months Weight loss encouraged Annual eye examination encouraged Screening colonoscopy recommended  Nyoka Cowden

## 2016-04-10 NOTE — Patient Instructions (Addendum)
Limit your sodium (Salt) intake    It is important that you exercise regularly, at least 20 minutes 3 to 4 times per week.  If you develop chest pain or shortness of breath seek  medical attention.   Please check your hemoglobin A1c every 3-6  Months  You need to lose weight.  Consider a lower calorie diet and regular exercise.  Please see your eye doctor yearly to check for diabetic eye damage  Schedule your colonoscopy to help detect colon cancer.

## 2016-04-16 ENCOUNTER — Telehealth: Payer: Self-pay | Admitting: Internal Medicine

## 2016-04-16 NOTE — Telephone Encounter (Signed)
Deborah, please see message. 

## 2016-04-16 NOTE — Telephone Encounter (Signed)
Pts wife is calling to ask if you could go ahead and schedule the echocardiogram and then give her or pt a call back to let them know when it scheduled and where pls.

## 2016-07-09 DIAGNOSIS — H6123 Impacted cerumen, bilateral: Secondary | ICD-10-CM | POA: Diagnosis not present

## 2016-09-12 ENCOUNTER — Telehealth: Payer: Self-pay | Admitting: Internal Medicine

## 2016-09-12 MED ORDER — AMLODIPINE BESYLATE 10 MG PO TABS: 10.0000 mg | ORAL_TABLET | Freq: Every day | ORAL | 1 refills | Status: DC

## 2016-09-12 NOTE — Telephone Encounter (Signed)
Medication refilled

## 2016-09-12 NOTE — Telephone Encounter (Signed)
° ° ° °  Pt request refill of the following:  amLODipine (NORVASC) 10 MG tablet   Phamacy:  [facility] [place]

## 2016-09-18 ENCOUNTER — Telehealth: Payer: Self-pay | Admitting: Internal Medicine

## 2016-09-18 NOTE — Telephone Encounter (Signed)
° ° ° °  Pt contacted the pharmacy and was told they have no refill for the below med. Please resend    amLODipine (NORVASC) 10 MG tablet   [facility] [place]

## 2016-09-18 NOTE — Telephone Encounter (Signed)
Contacted the pharmacy and was informed by [facility] that Norvasc was filled and ready for pick up. Pt was called and informed.

## 2016-10-29 IMAGING — DX DG CHEST 2V
2 series · 2 of 2 positions shown · non-contrast
Comparison: 09/26/2015

CLINICAL DATA: Followup right-sided pneumothorax and rib fractures.

EXAM:
CHEST  2 VIEW

[chest pa]
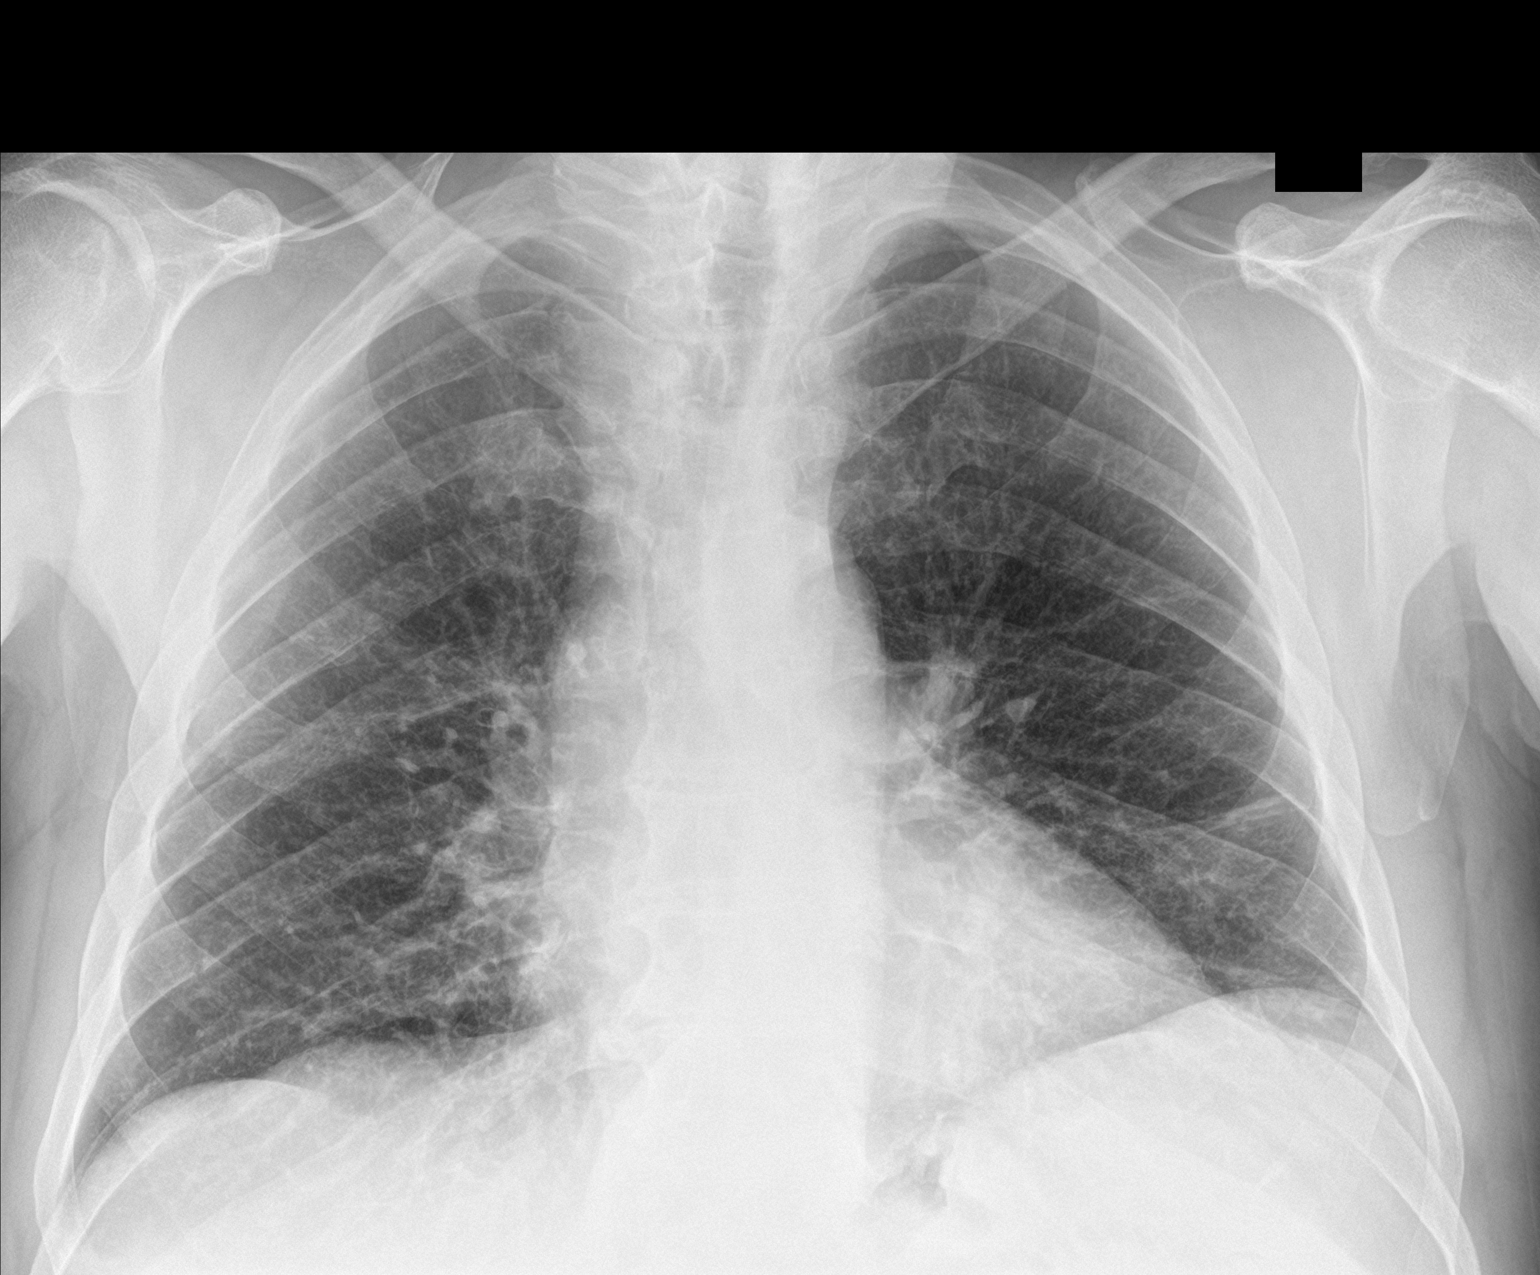

[chest lat]
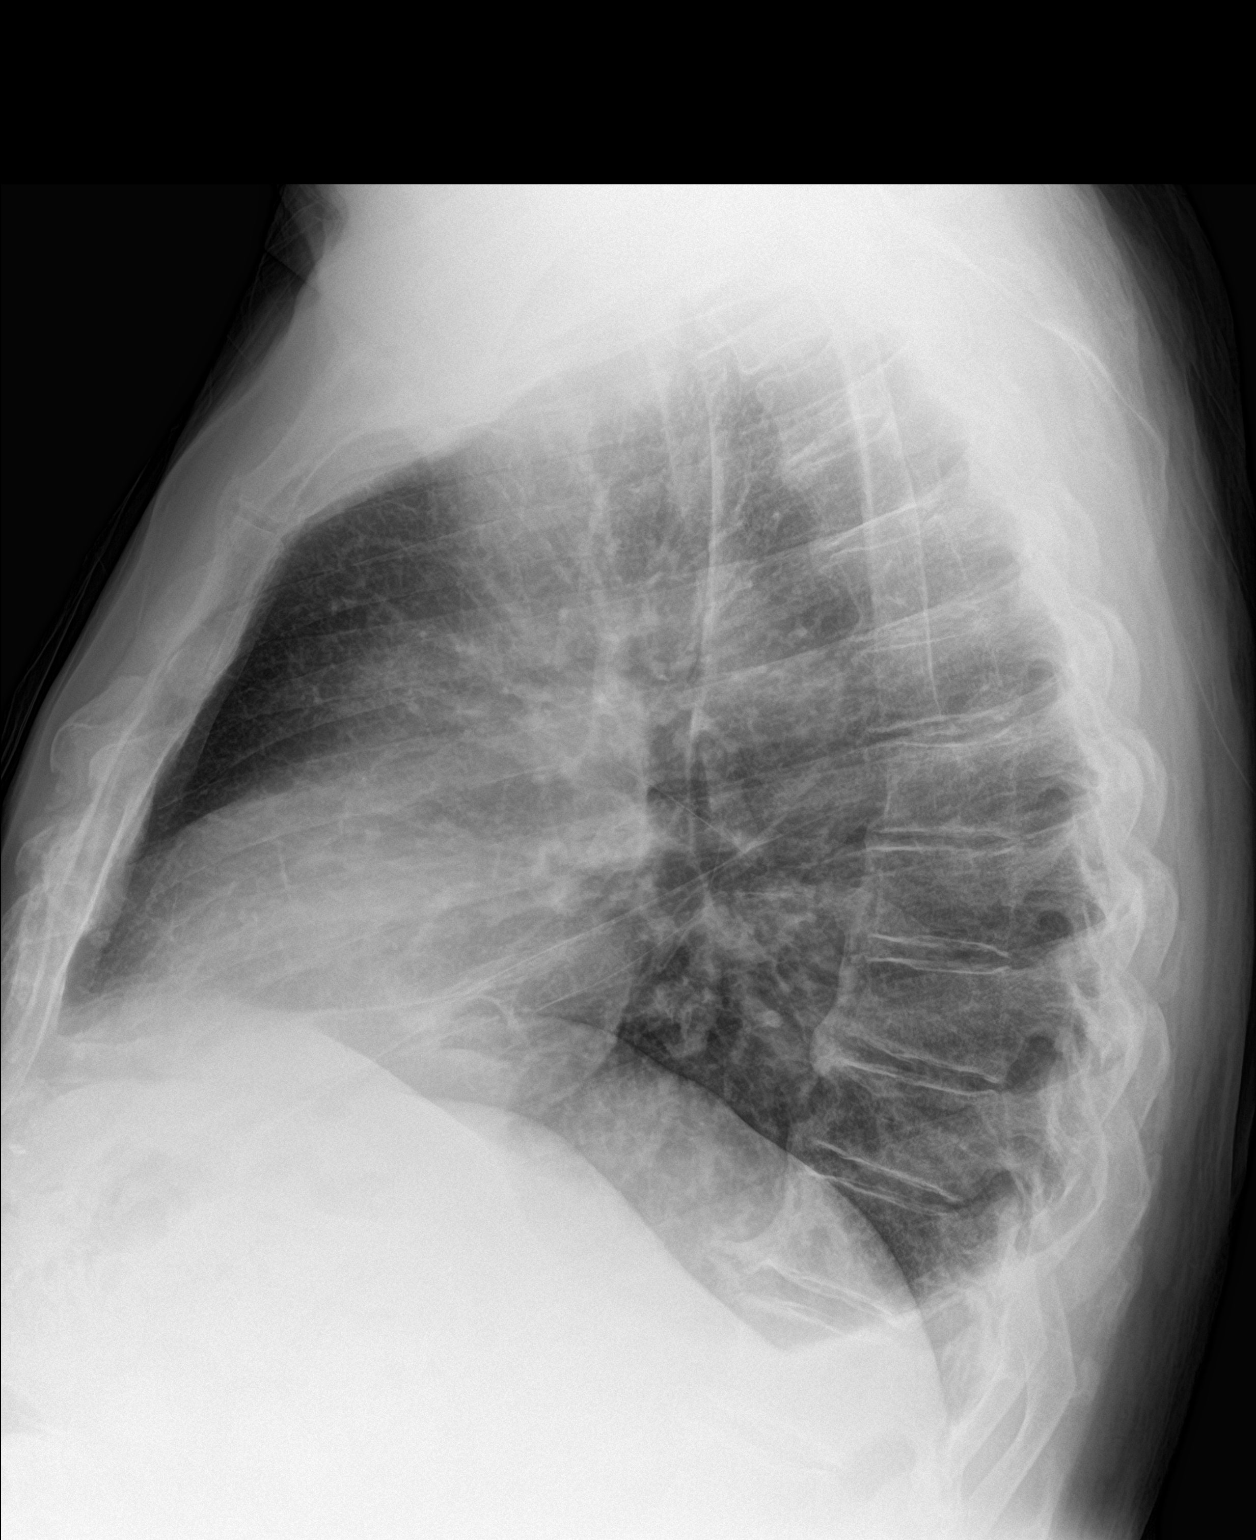

[2 of 2 positions shown; findings below may reference images not displayed]

FINDINGS: No persistent right pneumothorax seen on today's exam. Multiple
mildly displaced right-sided rib fractures are again demonstrated.

Decreased atelectasis seen in both lung bases since previous study.
Tiny right pleural effusions seen posteriorly on lateral projection.
No evidence of pulmonary consolidation or hemothorax.
IMPRESSION: No pneumothorax visualized. Decreased bibasilar atelectasis since
prior exam.

Multiple mildly displaced right rib fractures again noted. Tiny
residual right posterior pleural effusion.

## 2017-02-19 ENCOUNTER — Telehealth: Payer: Self-pay | Admitting: Internal Medicine

## 2017-02-19 NOTE — Telephone Encounter (Signed)
Pt need new zolpidem,ramipril, simvastatin, amlodipine, pioglitazone and hydrochlorothiazide  [facility] [place] [place]   [phone].  Pt is out of town and will be returning in about 2-3 weeks.

## 2017-02-20 MED ORDER — SIMVASTATIN 20 MG PO TABS: 20.0000 mg | ORAL_TABLET | Freq: Every day | ORAL | 0 refills | Status: DC

## 2017-02-20 MED ORDER — AMLODIPINE BESYLATE 10 MG PO TABS: 10.0000 mg | ORAL_TABLET | Freq: Every day | ORAL | 0 refills | Status: DC

## 2017-02-20 MED ORDER — HYDROCHLOROTHIAZIDE 25 MG PO TABS: 25.0000 mg | ORAL_TABLET | Freq: Every day | ORAL | 0 refills | Status: DC

## 2017-02-20 MED ORDER — ZOLPIDEM TARTRATE 10 MG PO TABS: 10.0000 mg | ORAL_TABLET | Freq: Every evening | ORAL | 0 refills | Status: DC | PRN

## 2017-02-20 MED ORDER — PIOGLITAZONE HCL 30 MG PO TABS: 30.0000 mg | ORAL_TABLET | Freq: Every day | ORAL | 0 refills | Status: AC

## 2017-02-20 MED ORDER — RAMIPRIL 10 MG PO CAPS: 10.0000 mg | ORAL_CAPSULE | Freq: Every day | ORAL | 0 refills | Status: DC

## 2017-02-20 NOTE — Telephone Encounter (Signed)
Zolpidem prescription has expired. Ok to refill medication? Please advise

## 2017-02-20 NOTE — Telephone Encounter (Signed)
Okay for refill?  

## 2017-02-20 NOTE — Addendum Note (Signed)
Addended by: Neville RouteJAIMES, Zakkiyya Barno G on: 02/20/2017 12:08 PM   Modules accepted: Orders

## 2017-03-21 DIAGNOSIS — Z0100 Encounter for examination of eyes and vision without abnormal findings: Secondary | ICD-10-CM | POA: Diagnosis not present

## 2017-03-21 DIAGNOSIS — I10 Essential (primary) hypertension: Secondary | ICD-10-CM | POA: Diagnosis not present

## 2017-04-23 ENCOUNTER — Ambulatory Visit: Payer: Medicare HMO | Admitting: Internal Medicine

## 2017-04-23 ENCOUNTER — Encounter: Payer: Self-pay | Admitting: Internal Medicine

## 2017-04-23 VITALS — BP 122/66 | HR 91 | Temp 98.0°F | Ht 74.0 in | Wt 257.2 lb

## 2017-04-23 DIAGNOSIS — R011 Cardiac murmur, unspecified: Secondary | ICD-10-CM | POA: Diagnosis not present

## 2017-04-23 DIAGNOSIS — E785 Hyperlipidemia, unspecified: Secondary | ICD-10-CM | POA: Diagnosis not present

## 2017-04-23 DIAGNOSIS — E119 Type 2 diabetes mellitus without complications: Secondary | ICD-10-CM | POA: Diagnosis not present

## 2017-04-23 DIAGNOSIS — I10 Essential (primary) hypertension: Secondary | ICD-10-CM

## 2017-04-23 LAB — COMPREHENSIVE METABOLIC PANEL
ALBUMIN: 4.4 g/dL (ref 3.5–5.2)
ALT: 24 U/L (ref 0–53)
AST: 18 U/L (ref 0–37)
Alkaline Phosphatase: 65 U/L (ref 39–117)
BUN: 25 mg/dL — ABNORMAL HIGH (ref 6–23)
CALCIUM: 10 mg/dL (ref 8.4–10.5)
CHLORIDE: 103 meq/L (ref 96–112)
CO2: 29 meq/L (ref 19–32)
Creatinine, Ser: 1.54 mg/dL — ABNORMAL HIGH (ref 0.40–1.50)
GFR: 47 mL/min — AB (ref >60.00)
Glucose, Bld: 115 mg/dL — ABNORMAL HIGH (ref 70–99)
POTASSIUM: 4.9 meq/L (ref 3.5–5.1)
Sodium: 141 mEq/L (ref 135–145)
Total Bilirubin: 0.6 mg/dL (ref 0.2–1.2)
Total Protein: 7.4 g/dL (ref 6.0–8.3)

## 2017-04-23 LAB — CBC WITH DIFFERENTIAL/PLATELET
BASOS PCT: 0.6 % (ref 0.0–3.0)
Basophils Absolute: 0.1 10*3/uL (ref 0.0–0.1)
EOS PCT: 2.8 % (ref 0.0–5.0)
Eosinophils Absolute: 0.2 10*3/uL (ref 0.0–0.7)
HEMATOCRIT: 47.8 % (ref 39.0–52.0)
HEMOGLOBIN: 16 g/dL (ref 13.0–17.0)
LYMPHS PCT: 30.5 % (ref 12.0–46.0)
Lymphs Abs: 2.5 10*3/uL (ref 0.7–4.0)
MCHC: 33.4 g/dL (ref 30.0–36.0)
MCV: 91.8 fl (ref 78.0–100.0)
MONOS PCT: 11.4 % (ref 3.0–12.0)
Monocytes Absolute: 0.9 10*3/uL (ref 0.1–1.0)
Neutro Abs: 4.6 10*3/uL (ref 1.4–7.7)
Neutrophils Relative %: 54.7 % (ref 43.0–77.0)
Platelets: 241 10*3/uL (ref 150.0–400.0)
RBC: 5.21 Mil/uL (ref 4.22–5.81)
RDW: 13.6 % (ref 11.5–15.5)
WBC: 8.3 10*3/uL (ref 4.0–10.5)

## 2017-04-23 LAB — MICROALBUMIN / CREATININE URINE RATIO
Creatinine,U: 92.1 mg/dL
MICROALB UR: 10.8 mg/dL — AB (ref 0.0–1.9)
Microalb Creat Ratio: 11.8 mg/g (ref 0.0–30.0)

## 2017-04-23 LAB — LIPID PANEL
CHOL/HDL RATIO: 6
Cholesterol: 201 mg/dL — ABNORMAL HIGH (ref 0–200)
HDL: 33.6 mg/dL — ABNORMAL LOW (ref >39.00)
NONHDL: 167
Triglycerides: 212 mg/dL — ABNORMAL HIGH (ref 0.0–149.0)
VLDL: 42.4 mg/dL — AB (ref 0.0–40.0)

## 2017-04-23 LAB — TSH: TSH: 2.95 u[IU]/mL (ref 0.35–4.50)

## 2017-04-23 LAB — LDL CHOLESTEROL, DIRECT: LDL DIRECT: 126 mg/dL

## 2017-04-23 LAB — HEMOGLOBIN A1C: HEMOGLOBIN A1C: 7.4 % — AB (ref 4.6–6.5)

## 2017-04-23 MED ORDER — ZOLPIDEM TARTRATE 10 MG PO TABS: 10.0000 mg | ORAL_TABLET | Freq: Every evening | ORAL | 0 refills | Status: DC | PRN

## 2017-04-23 NOTE — Patient Instructions (Addendum)
Limit your sodium (Salt) intake   Please check your hemoglobin A1c every 3-6 months    It is important that you exercise regularly, at least 20 minutes 3 to 4 times per week.  If you develop chest pain or shortness of breath seek  medical attention.  You need to lose weight.  Consider a lower calorie diet and regular exercise.  Please see your eye doctor yearly to check for diabetic eye damage  2D echocardiogram as discussed

## 2017-04-23 NOTE — Progress Notes (Signed)
 Subjective:    Patient ID: Willie Smith, male    DOB: 02/24/1941, 76 y.o.   MRN: [id number]  HPI  76-year-old male who is seen today after a prolonged absence.  Medical problems include a history of essential hypertension.  He also has a history of type 2 diabetes controlled on oral agents. Doing well.  No cardiopulmonary complaints.  Medical regimen includes metformin and pioglitazone.  Lab Results  Component Value Date   HGBA1C 7.4 (H) 04/23/2017    Past Medical History:  Diagnosis Date  . GERD (gastroesophageal reflux disease)   . Hemorrhoids   . Hyperlipidemia   . Hypertension   . Systolic murmur      Social History   Socioeconomic History  . Marital status: Single    Spouse name: Not on file  . Number of children: Not on file  . Years of education: Not on file  . Highest education level: Not on file  Social Needs  . Financial resource strain: Not on file  . Food insecurity - worry: Not on file  . Food insecurity - inability: Not on file  . Transportation needs - medical: Not on file  . Transportation needs - non-medical: Not on file  Occupational History  . Not on file  Tobacco Use  . Smoking status: Former Smoker    Last attempt to quit: 06/18/2005    Years since quitting: 11.8  . Smokeless tobacco: Never Used  Substance and Sexual Activity  . Alcohol use: Yes    Comment: occasionally  . Drug use: No  . Sexual activity: Not on file  Other Topics Concern  . Not on file  Social History Narrative  . Not on file    Past Surgical History:  Procedure Laterality Date  . colonsocopy     declines  . surgery elbow bilaterally    . TONSILLECTOMY  1948    Family History  Problem Relation Age of Onset  . Heart attack Mother   . Hypertension Father   . Diabetes Father   . Hypertension Brother   . Hypertension Brother   . Hypertension Sister   . Hypertension Sister   . Hypertension Sister     No Known Allergies  Current Outpatient Medications on  File Prior to Visit  Medication Sig Dispense Refill  . ACCU-CHEK SOFTCLIX LANCETS lancets USE TO CHECK BLOOD SUGAR DAILY AND PRN 100 each 12  . amLODipine (NORVASC) 10 MG tablet Take 1 tablet (10 mg total) by mouth daily. 90 tablet 0  . aspirin 81 MG tablet Take 81 mg by mouth daily.    . Blood Glucose Monitoring Suppl (ACCU-CHEK AVIVA PLUS) w/Device KIT USE TO CHECK BLOOD SUGAR 1 kit 0  . glucose blood (ACCU-CHEK AVIVA PLUS) test strip USE TO CHECK BLOOD SUGAR DAILY AND PRN 100 each 12  . hydrochlorothiazide (HYDRODIURIL) 25 MG tablet Take 1 tablet (25 mg total) by mouth daily. 90 tablet 0  . omeprazole (PRILOSEC OTC) 20 MG tablet Take 1 tablet (20 mg total) by mouth daily as needed (for heartburn). 90 tablet 1  . pioglitazone (ACTOS) 30 MG tablet Take 1 tablet (30 mg total) by mouth daily. 90 tablet 0  . ramipril (ALTACE) 10 MG capsule Take 1 capsule (10 mg total) by mouth daily. 90 capsule 0  . simvastatin (ZOCOR) 20 MG tablet Take 1 tablet (20 mg total) by mouth at bedtime. 90 tablet 0  . metFORMIN (GLUCOPHAGE) 1000 MG tablet Take 1 tablet (1,000 mg total)   by mouth 2 (two) times daily with a meal. (Patient not taking: Reported on 04/23/2017) 180 tablet 1   No current facility-administered medications on file prior to visit.     BP 122/66 (BP Location: Left Arm, Patient Position: Sitting, Cuff Size: Normal)   Pulse 91   Temp 98 F (36.7 C) (Oral)   Ht 6' 2" (1.88 m)   Wt 257 lb 3.2 oz (116.7 kg)   SpO2 90%   BMI 33.02 kg/m     Review of Systems  Constitutional: Negative for appetite change, chills, fatigue and fever.  HENT: Negative for congestion, dental problem, ear pain, hearing loss, sore throat, tinnitus, trouble swallowing and voice change.   Eyes: Negative for pain, discharge and visual disturbance.  Respiratory: Negative for cough, chest tightness, wheezing and stridor.   Cardiovascular: Negative for chest pain, palpitations and leg swelling.  Gastrointestinal: Negative  for abdominal distention, abdominal pain, blood in stool, constipation, diarrhea, nausea and vomiting.  Genitourinary: Negative for difficulty urinating, discharge, flank pain, genital sores, hematuria and urgency.  Musculoskeletal: Negative for arthralgias, back pain, gait problem, joint swelling, myalgias and neck stiffness.  Skin: Negative for rash.  Neurological: Negative for dizziness, syncope, speech difficulty, weakness, numbness and headaches.  Hematological: Negative for adenopathy. Does not bruise/bleed easily.  Psychiatric/Behavioral: Negative for behavioral problems and dysphoric mood. The patient is not nervous/anxious.        Objective:   Physical Exam  Constitutional: He is oriented to person, place, and time. He appears well-developed.  Weight 257 Blood pressure 122/66   HENT:  Head: Normocephalic.  Right Ear: External ear normal.  Left Ear: External ear normal.  Eyes: Conjunctivae and EOM are normal.  Neck: Normal range of motion.  Cardiovascular: Normal rate and normal heart sounds.  Pulmonary/Chest: Breath sounds normal.  Abdominal: Bowel sounds are normal.  Musculoskeletal: Normal range of motion. He exhibits no edema or tenderness.  Neurological: He is alert and oriented to person, place, and time.  Psychiatric: He has a normal mood and affect. His behavior is normal.          Assessment & Plan:   Diabetes mellitus.  We will check a hemoglobin A1c urine for microalbumin Dyslipidemia.  Will review a lipid profile Essential hypertension nice control obesity weight loss encouraged Systolic murmur.  He was encouraged to have a 2D echocardiogram in the past which he has not pursued.  Will reschedule.  Rule out hemodynamically significant aortic stenosis  Insomnia.  Ambien refilled  , FRANK   Follow-up 6 months  , FRANK  

## 2017-05-28 ENCOUNTER — Other Ambulatory Visit (HOSPITAL_COMMUNITY): Payer: Medicare HMO

## 2017-06-21 ENCOUNTER — Telehealth: Payer: Self-pay | Admitting: Internal Medicine

## 2017-06-21 ENCOUNTER — Other Ambulatory Visit: Payer: Self-pay | Admitting: Internal Medicine

## 2017-06-21 MED ORDER — SIMVASTATIN 20 MG PO TABS: 20.0000 mg | ORAL_TABLET | Freq: Every day | ORAL | 0 refills | Status: AC

## 2017-06-21 MED ORDER — RAMIPRIL 10 MG PO CAPS: 10.0000 mg | ORAL_CAPSULE | Freq: Every day | ORAL | 0 refills | Status: AC

## 2017-06-21 MED ORDER — HYDROCHLOROTHIAZIDE 25 MG PO TABS: 25.0000 mg | ORAL_TABLET | Freq: Every day | ORAL | 0 refills | Status: AC

## 2017-06-21 MED ORDER — AMLODIPINE BESYLATE 10 MG PO TABS: 10.0000 mg | ORAL_TABLET | Freq: Every day | ORAL | 0 refills | Status: AC

## 2017-06-21 MED ORDER — ZOLPIDEM TARTRATE 10 MG PO TABS: 10.0000 mg | ORAL_TABLET | Freq: Every evening | ORAL | 0 refills | Status: AC | PRN

## 2017-06-21 NOTE — Telephone Encounter (Signed)
Copied from CRM #[id number]. Topic: Quick Communication - See Telephone Encounter >> Jun 21, 2017 11:06 AM Jolayne Hainesaylor, Brittany L wrote: CRM for notification. See Telephone encounter for:   06/21/17.  hydrochlorothiazide (HYDRODIURIL) 25 MG tablet amLODipine (NORVASC) 10 MG tablet  simvastatin (ZOCOR) 20 MG tablet ramipril (ALTACE) 10 MG capsule  zolpidem (AMBIEN) 10 MG tablet   [facility] [address] - [place], [place] - [phone]

## 2017-06-21 NOTE — Telephone Encounter (Signed)
hydrochlorothiazide (HYDRODIURIL) 25 MG tablet amLODipine (NORVASC) 10 MG tablet  simvastatin (ZOCOR) 20 MG tablet ramipril (ALTACE) 10 MG capsule  [facility] [address] - [place], [place] - [phone]   zolpidem (AMBIEN) 10 MG tablet will be faxed to the pharmacy on Monday (06/24/17) when provider returns to the office. Awaiting for MD signature on Rx.

## 2017-06-24 NOTE — Telephone Encounter (Signed)
Zolpidem was faxed to pharmacy at 0831 on 06/24/17.

## 2017-08-13 ENCOUNTER — Other Ambulatory Visit: Payer: Self-pay

## 2017-08-19 ENCOUNTER — Other Ambulatory Visit: Payer: Self-pay | Admitting: Family Medicine

## 2017-08-19 MED ORDER — TRUEPLUS LANCETS 26G MISC: 12 refills | Status: AC

## 2017-08-19 MED ORDER — ACCU-CHEK SOFTCLIX LANCETS MISC: 12 refills | Status: AC

## 2017-08-19 MED ORDER — TRUE METRIX AIR GLUCOSE METER W/DEVICE KIT: 1.0000 | PACK | Freq: Three times a day (TID) | 0 refills | Status: AC

## 2017-08-19 MED ORDER — ALCOHOL WIPES 70 % PADS: MEDICATED_PAD | 3 refills | Status: AC

## 2017-08-19 MED ORDER — ACCU-CHEK AVIVA PLUS W/DEVICE KIT: PACK | 0 refills | Status: DC

## 2017-08-19 NOTE — Telephone Encounter (Signed)
Refill request from [facility] for True [facility] Kit, strips, lancets-true plus super thin, alcohol wipes, and Ambien.

## 2017-08-20 NOTE — Telephone Encounter (Signed)
Refills sent electronically

## 2017-09-06 ENCOUNTER — Telehealth: Payer: Self-pay | Admitting: *Deleted

## 2017-09-06 NOTE — Telephone Encounter (Signed)
Prior auth for Softclix Lancets sent to [email or web address]-key [id number]. Note state this Rx is available without authorization and I called [facility] and informed Toni AmendCourtney of this.
